# Patient Record
Sex: Male | Born: 1950 | Race: White
Health system: Southern US, Community
[De-identification: ages and names within clinical notes are randomized; demographics above are authoritative.]

---

## 2016-10-28 DIAGNOSIS — Z794 Long term (current) use of insulin: Secondary | ICD-10-CM | POA: Diagnosis not present

## 2016-10-28 DIAGNOSIS — E1142 Type 2 diabetes mellitus with diabetic polyneuropathy: Secondary | ICD-10-CM | POA: Diagnosis not present

## 2016-10-28 DIAGNOSIS — E1165 Type 2 diabetes mellitus with hyperglycemia: Secondary | ICD-10-CM | POA: Diagnosis not present

## 2016-10-28 DIAGNOSIS — Z89412 Acquired absence of left great toe: Secondary | ICD-10-CM | POA: Diagnosis not present

## 2016-10-28 DIAGNOSIS — M2041 Other hammer toe(s) (acquired), right foot: Secondary | ICD-10-CM | POA: Diagnosis not present

## 2016-11-25 DIAGNOSIS — Z86010 Personal history of colonic polyps: Secondary | ICD-10-CM | POA: Diagnosis not present

## 2016-11-25 DIAGNOSIS — D122 Benign neoplasm of ascending colon: Secondary | ICD-10-CM | POA: Diagnosis not present

## 2016-11-25 DIAGNOSIS — Z79899 Other long term (current) drug therapy: Secondary | ICD-10-CM | POA: Diagnosis not present

## 2016-11-25 DIAGNOSIS — K219 Gastro-esophageal reflux disease without esophagitis: Secondary | ICD-10-CM | POA: Diagnosis not present

## 2016-11-25 DIAGNOSIS — I10 Essential (primary) hypertension: Secondary | ICD-10-CM | POA: Diagnosis not present

## 2016-11-25 DIAGNOSIS — R05 Cough: Secondary | ICD-10-CM | POA: Diagnosis not present

## 2016-11-25 DIAGNOSIS — K2270 Barrett's esophagus without dysplasia: Secondary | ICD-10-CM | POA: Diagnosis not present

## 2016-11-25 DIAGNOSIS — J449 Chronic obstructive pulmonary disease, unspecified: Secondary | ICD-10-CM | POA: Diagnosis not present

## 2016-11-25 DIAGNOSIS — K449 Diaphragmatic hernia without obstruction or gangrene: Secondary | ICD-10-CM | POA: Diagnosis not present

## 2016-11-25 DIAGNOSIS — Z1211 Encounter for screening for malignant neoplasm of colon: Secondary | ICD-10-CM | POA: Diagnosis not present

## 2016-11-25 DIAGNOSIS — K5730 Diverticulosis of large intestine without perforation or abscess without bleeding: Secondary | ICD-10-CM | POA: Diagnosis not present

## 2016-11-25 DIAGNOSIS — K208 Other esophagitis: Secondary | ICD-10-CM | POA: Diagnosis not present

## 2016-11-25 DIAGNOSIS — K2900 Acute gastritis without bleeding: Secondary | ICD-10-CM | POA: Diagnosis not present

## 2016-11-25 DIAGNOSIS — F419 Anxiety disorder, unspecified: Secondary | ICD-10-CM | POA: Diagnosis not present

## 2016-11-25 DIAGNOSIS — Z794 Long term (current) use of insulin: Secondary | ICD-10-CM | POA: Diagnosis not present

## 2016-11-25 DIAGNOSIS — K635 Polyp of colon: Secondary | ICD-10-CM | POA: Diagnosis not present

## 2016-11-25 DIAGNOSIS — E785 Hyperlipidemia, unspecified: Secondary | ICD-10-CM | POA: Diagnosis not present

## 2016-11-25 DIAGNOSIS — E119 Type 2 diabetes mellitus without complications: Secondary | ICD-10-CM | POA: Diagnosis not present

## 2016-11-25 DIAGNOSIS — K2970 Gastritis, unspecified, without bleeding: Secondary | ICD-10-CM | POA: Diagnosis not present

## 2016-11-25 DIAGNOSIS — K209 Esophagitis, unspecified: Secondary | ICD-10-CM | POA: Diagnosis not present

## 2016-12-13 DIAGNOSIS — Z794 Long term (current) use of insulin: Secondary | ICD-10-CM | POA: Diagnosis not present

## 2016-12-13 DIAGNOSIS — I10 Essential (primary) hypertension: Secondary | ICD-10-CM | POA: Diagnosis not present

## 2016-12-13 DIAGNOSIS — E782 Mixed hyperlipidemia: Secondary | ICD-10-CM | POA: Diagnosis not present

## 2016-12-13 DIAGNOSIS — E1165 Type 2 diabetes mellitus with hyperglycemia: Secondary | ICD-10-CM | POA: Diagnosis not present

## 2016-12-13 DIAGNOSIS — E1142 Type 2 diabetes mellitus with diabetic polyneuropathy: Secondary | ICD-10-CM | POA: Diagnosis not present

## 2017-01-14 ENCOUNTER — Other Ambulatory Visit: Payer: Self-pay | Admitting: Pharmacist

## 2017-01-14 NOTE — Patient Outreach (Signed)
[facility]) Care Management  01/14/2017  Jonathon Martinez 1951/03/07 [id number]  Late entry for 01/12/17.   Patient was referred to [place] by [employer] for evaluation of patient assistance options for medications, reportedly insulin.    No answer, HIPAA compliant message left requesting return call.   Plan:  If no return call, will make another outreach attempt next week.   Karrie Meres, PharmD, [place] [phone]

## 2017-01-24 ENCOUNTER — Other Ambulatory Visit: Payer: Self-pay | Admitting: Pharmacist

## 2017-01-24 NOTE — Patient Outreach (Signed)
[place] [facility]) Care Management  01/24/2017  ESEQUIEL KLEINFELTER 1951/07/25 [id number]  Second unsuccessful phone outreach attempt to patient.  No answer, HIPAA compliant message left requesting return call.   Plan:  If no return call, will make third outreach attempt in the next 10 business days.   Karrie Meres, PharmD, [place] [phone]

## 2017-01-27 ENCOUNTER — Other Ambulatory Visit: Payer: Self-pay | Admitting: Pharmacist

## 2017-01-27 NOTE — Patient Outreach (Signed)
[facility]) Care Management  01/27/2017  BURNIS HALLING [birth year] [id number]  Third unsuccessful phone outreach attempt to patient.  HIPAA compliant message left requesting return call.   Plan:  Will make one final outreach attempt next week if patient does not return call.   Karrie Meres, PharmD, [place] [phone]

## 2017-02-03 ENCOUNTER — Other Ambulatory Visit: Payer: Self-pay | Admitting: Pharmacist

## 2017-02-03 NOTE — Patient Outreach (Signed)
[place]) Care Management  02/03/2017  Jonathon Martinez 10-26-1950 [id number]  Fourth unsuccessful phone outreach attempt to patient.  No answer and call picked up then disconnected twice with no one answering.    Plan:  Will send patient outreach letter.  If no response in 10 days, will close case.   Karrie Meres, PharmD, [place] [phone]

## 2017-02-07 ENCOUNTER — Encounter: Payer: Self-pay | Admitting: Pharmacist

## 2017-02-21 ENCOUNTER — Other Ambulatory Visit: Payer: Self-pay | Admitting: Pharmacist

## 2017-02-21 NOTE — Patient Outreach (Signed)
[facility]) Care Management  02/21/2017  Jonathon Martinez [birth year] [id number]  No patient response from four phone outreach attempts or outreach letter being sent to patient.   Plan:  Will close patient's pharmacy case due to no patient response.   Karrie Meres, PharmD, [place] [phone]

## 2017-02-23 DIAGNOSIS — D509 Iron deficiency anemia, unspecified: Secondary | ICD-10-CM | POA: Diagnosis not present

## 2017-02-23 DIAGNOSIS — K219 Gastro-esophageal reflux disease without esophagitis: Secondary | ICD-10-CM | POA: Diagnosis not present

## 2017-02-23 DIAGNOSIS — J309 Allergic rhinitis, unspecified: Secondary | ICD-10-CM | POA: Diagnosis not present

## 2017-02-23 DIAGNOSIS — Z89412 Acquired absence of left great toe: Secondary | ICD-10-CM | POA: Diagnosis not present

## 2017-02-23 DIAGNOSIS — Z79899 Other long term (current) drug therapy: Secondary | ICD-10-CM | POA: Diagnosis not present

## 2017-02-23 DIAGNOSIS — I10 Essential (primary) hypertension: Secondary | ICD-10-CM | POA: Diagnosis not present

## 2017-02-23 DIAGNOSIS — M2550 Pain in unspecified joint: Secondary | ICD-10-CM | POA: Diagnosis not present

## 2017-02-23 DIAGNOSIS — E1165 Type 2 diabetes mellitus with hyperglycemia: Secondary | ICD-10-CM | POA: Diagnosis not present

## 2017-02-23 DIAGNOSIS — G4733 Obstructive sleep apnea (adult) (pediatric): Secondary | ICD-10-CM | POA: Diagnosis not present

## 2017-02-23 DIAGNOSIS — E1142 Type 2 diabetes mellitus with diabetic polyneuropathy: Secondary | ICD-10-CM | POA: Diagnosis not present

## 2017-02-23 DIAGNOSIS — J4520 Mild intermittent asthma, uncomplicated: Secondary | ICD-10-CM | POA: Diagnosis not present

## 2017-02-23 DIAGNOSIS — I6523 Occlusion and stenosis of bilateral carotid arteries: Secondary | ICD-10-CM | POA: Diagnosis not present

## 2017-02-23 DIAGNOSIS — K2270 Barrett's esophagus without dysplasia: Secondary | ICD-10-CM | POA: Diagnosis not present

## 2017-03-25 DIAGNOSIS — S0181XA Laceration without foreign body of other part of head, initial encounter: Secondary | ICD-10-CM | POA: Diagnosis not present

## 2017-05-02 DIAGNOSIS — L0202 Furuncle of face: Secondary | ICD-10-CM | POA: Diagnosis not present

## 2017-05-02 DIAGNOSIS — R220 Localized swelling, mass and lump, head: Secondary | ICD-10-CM | POA: Diagnosis not present

## 2017-06-03 DIAGNOSIS — L0291 Cutaneous abscess, unspecified: Secondary | ICD-10-CM | POA: Diagnosis not present

## 2017-06-03 DIAGNOSIS — Z794 Long term (current) use of insulin: Secondary | ICD-10-CM | POA: Diagnosis not present

## 2017-06-03 DIAGNOSIS — E1142 Type 2 diabetes mellitus with diabetic polyneuropathy: Secondary | ICD-10-CM | POA: Diagnosis not present

## 2017-06-03 DIAGNOSIS — E1165 Type 2 diabetes mellitus with hyperglycemia: Secondary | ICD-10-CM | POA: Diagnosis not present

## 2017-06-20 DIAGNOSIS — L0201 Cutaneous abscess of face: Secondary | ICD-10-CM | POA: Diagnosis not present

## 2017-06-20 DIAGNOSIS — L0291 Cutaneous abscess, unspecified: Secondary | ICD-10-CM | POA: Diagnosis not present

## 2017-06-20 DIAGNOSIS — L089 Local infection of the skin and subcutaneous tissue, unspecified: Secondary | ICD-10-CM | POA: Diagnosis not present

## 2017-06-20 DIAGNOSIS — S0085XA Superficial foreign body of other part of head, initial encounter: Secondary | ICD-10-CM | POA: Diagnosis not present

## 2017-07-04 DIAGNOSIS — L089 Local infection of the skin and subcutaneous tissue, unspecified: Secondary | ICD-10-CM | POA: Diagnosis not present

## 2017-07-04 DIAGNOSIS — S0085XD Superficial foreign body of other part of head, subsequent encounter: Secondary | ICD-10-CM | POA: Diagnosis not present

## 2017-07-25 DIAGNOSIS — E1142 Type 2 diabetes mellitus with diabetic polyneuropathy: Secondary | ICD-10-CM | POA: Diagnosis not present

## 2017-07-25 DIAGNOSIS — E1165 Type 2 diabetes mellitus with hyperglycemia: Secondary | ICD-10-CM | POA: Diagnosis not present

## 2017-07-25 DIAGNOSIS — I10 Essential (primary) hypertension: Secondary | ICD-10-CM | POA: Diagnosis not present

## 2017-07-25 DIAGNOSIS — Z794 Long term (current) use of insulin: Secondary | ICD-10-CM | POA: Diagnosis not present

## 2017-07-25 DIAGNOSIS — E782 Mixed hyperlipidemia: Secondary | ICD-10-CM | POA: Diagnosis not present

## 2017-11-02 DIAGNOSIS — L02611 Cutaneous abscess of right foot: Secondary | ICD-10-CM | POA: Diagnosis not present

## 2017-11-02 DIAGNOSIS — Z89419 Acquired absence of unspecified great toe: Secondary | ICD-10-CM | POA: Diagnosis not present

## 2017-11-02 DIAGNOSIS — E1142 Type 2 diabetes mellitus with diabetic polyneuropathy: Secondary | ICD-10-CM | POA: Diagnosis not present

## 2017-11-02 DIAGNOSIS — E11621 Type 2 diabetes mellitus with foot ulcer: Secondary | ICD-10-CM | POA: Diagnosis not present

## 2017-11-03 DIAGNOSIS — L02611 Cutaneous abscess of right foot: Secondary | ICD-10-CM | POA: Diagnosis not present

## 2017-11-17 DIAGNOSIS — E1142 Type 2 diabetes mellitus with diabetic polyneuropathy: Secondary | ICD-10-CM | POA: Diagnosis not present

## 2017-11-17 DIAGNOSIS — L03031 Cellulitis of right toe: Secondary | ICD-10-CM | POA: Diagnosis not present

## 2017-11-17 DIAGNOSIS — L97512 Non-pressure chronic ulcer of other part of right foot with fat layer exposed: Secondary | ICD-10-CM | POA: Diagnosis not present

## 2017-11-17 DIAGNOSIS — E11621 Type 2 diabetes mellitus with foot ulcer: Secondary | ICD-10-CM | POA: Diagnosis not present

## 2018-02-20 DIAGNOSIS — E782 Mixed hyperlipidemia: Secondary | ICD-10-CM | POA: Diagnosis not present

## 2018-02-20 DIAGNOSIS — Z794 Long term (current) use of insulin: Secondary | ICD-10-CM | POA: Diagnosis not present

## 2018-02-20 DIAGNOSIS — E1165 Type 2 diabetes mellitus with hyperglycemia: Secondary | ICD-10-CM | POA: Diagnosis not present

## 2018-02-20 DIAGNOSIS — I10 Essential (primary) hypertension: Secondary | ICD-10-CM | POA: Diagnosis not present

## 2018-02-20 DIAGNOSIS — E1142 Type 2 diabetes mellitus with diabetic polyneuropathy: Secondary | ICD-10-CM | POA: Diagnosis not present

## 2018-04-20 DIAGNOSIS — E782 Mixed hyperlipidemia: Secondary | ICD-10-CM | POA: Diagnosis not present

## 2018-04-20 DIAGNOSIS — Z125 Encounter for screening for malignant neoplasm of prostate: Secondary | ICD-10-CM | POA: Diagnosis not present

## 2018-04-20 DIAGNOSIS — R358 Other polyuria: Secondary | ICD-10-CM | POA: Diagnosis not present

## 2018-04-20 DIAGNOSIS — F331 Major depressive disorder, recurrent, moderate: Secondary | ICD-10-CM | POA: Diagnosis not present

## 2018-04-20 DIAGNOSIS — K2270 Barrett's esophagus without dysplasia: Secondary | ICD-10-CM | POA: Diagnosis not present

## 2018-04-20 DIAGNOSIS — Z79899 Other long term (current) drug therapy: Secondary | ICD-10-CM | POA: Diagnosis not present

## 2018-04-20 DIAGNOSIS — E1142 Type 2 diabetes mellitus with diabetic polyneuropathy: Secondary | ICD-10-CM | POA: Diagnosis not present

## 2018-04-20 DIAGNOSIS — I10 Essential (primary) hypertension: Secondary | ICD-10-CM | POA: Diagnosis not present

## 2018-04-20 DIAGNOSIS — Z794 Long term (current) use of insulin: Secondary | ICD-10-CM | POA: Diagnosis not present

## 2018-04-20 DIAGNOSIS — D509 Iron deficiency anemia, unspecified: Secondary | ICD-10-CM | POA: Diagnosis not present

## 2018-04-20 DIAGNOSIS — E1165 Type 2 diabetes mellitus with hyperglycemia: Secondary | ICD-10-CM | POA: Diagnosis not present

## 2018-04-20 DIAGNOSIS — K219 Gastro-esophageal reflux disease without esophagitis: Secondary | ICD-10-CM | POA: Diagnosis not present

## 2018-05-11 DIAGNOSIS — N318 Other neuromuscular dysfunction of bladder: Secondary | ICD-10-CM | POA: Diagnosis not present

## 2018-05-11 DIAGNOSIS — N3000 Acute cystitis without hematuria: Secondary | ICD-10-CM | POA: Diagnosis not present

## 2018-05-11 DIAGNOSIS — R972 Elevated prostate specific antigen [PSA]: Secondary | ICD-10-CM | POA: Diagnosis not present

## 2018-05-11 DIAGNOSIS — N401 Enlarged prostate with lower urinary tract symptoms: Secondary | ICD-10-CM | POA: Diagnosis not present

## 2018-05-17 DIAGNOSIS — R972 Elevated prostate specific antigen [PSA]: Secondary | ICD-10-CM | POA: Diagnosis not present

## 2018-05-24 DIAGNOSIS — R972 Elevated prostate specific antigen [PSA]: Secondary | ICD-10-CM | POA: Diagnosis not present

## 2018-06-26 DIAGNOSIS — E782 Mixed hyperlipidemia: Secondary | ICD-10-CM | POA: Diagnosis not present

## 2018-06-26 DIAGNOSIS — I10 Essential (primary) hypertension: Secondary | ICD-10-CM | POA: Diagnosis not present

## 2018-06-26 DIAGNOSIS — E1142 Type 2 diabetes mellitus with diabetic polyneuropathy: Secondary | ICD-10-CM | POA: Diagnosis not present

## 2018-06-26 DIAGNOSIS — Z794 Long term (current) use of insulin: Secondary | ICD-10-CM | POA: Diagnosis not present

## 2018-06-26 DIAGNOSIS — E1165 Type 2 diabetes mellitus with hyperglycemia: Secondary | ICD-10-CM | POA: Diagnosis not present

## 2018-07-10 DIAGNOSIS — M2041 Other hammer toe(s) (acquired), right foot: Secondary | ICD-10-CM | POA: Diagnosis not present

## 2018-07-10 DIAGNOSIS — E1165 Type 2 diabetes mellitus with hyperglycemia: Secondary | ICD-10-CM | POA: Diagnosis not present

## 2018-07-10 DIAGNOSIS — E1142 Type 2 diabetes mellitus with diabetic polyneuropathy: Secondary | ICD-10-CM | POA: Diagnosis not present

## 2018-08-21 DIAGNOSIS — E782 Mixed hyperlipidemia: Secondary | ICD-10-CM | POA: Diagnosis not present

## 2018-08-21 DIAGNOSIS — E1142 Type 2 diabetes mellitus with diabetic polyneuropathy: Secondary | ICD-10-CM | POA: Diagnosis not present

## 2018-08-21 DIAGNOSIS — Z0000 Encounter for general adult medical examination without abnormal findings: Secondary | ICD-10-CM | POA: Diagnosis not present

## 2018-08-21 DIAGNOSIS — R972 Elevated prostate specific antigen [PSA]: Secondary | ICD-10-CM | POA: Diagnosis not present

## 2018-08-21 DIAGNOSIS — Z794 Long term (current) use of insulin: Secondary | ICD-10-CM | POA: Diagnosis not present

## 2018-08-21 DIAGNOSIS — F330 Major depressive disorder, recurrent, mild: Secondary | ICD-10-CM | POA: Diagnosis not present

## 2018-08-21 DIAGNOSIS — E1165 Type 2 diabetes mellitus with hyperglycemia: Secondary | ICD-10-CM | POA: Diagnosis not present

## 2018-08-21 DIAGNOSIS — Z79899 Other long term (current) drug therapy: Secondary | ICD-10-CM | POA: Diagnosis not present

## 2018-08-21 DIAGNOSIS — I10 Essential (primary) hypertension: Secondary | ICD-10-CM | POA: Diagnosis not present

## 2018-10-24 DIAGNOSIS — J218 Acute bronchiolitis due to other specified organisms: Secondary | ICD-10-CM | POA: Diagnosis not present

## 2018-10-24 DIAGNOSIS — B9789 Other viral agents as the cause of diseases classified elsewhere: Secondary | ICD-10-CM | POA: Diagnosis not present

## 2018-11-23 DIAGNOSIS — N401 Enlarged prostate with lower urinary tract symptoms: Secondary | ICD-10-CM | POA: Diagnosis not present

## 2018-11-23 DIAGNOSIS — R972 Elevated prostate specific antigen [PSA]: Secondary | ICD-10-CM | POA: Diagnosis not present

## 2018-11-23 DIAGNOSIS — N3020 Other chronic cystitis without hematuria: Secondary | ICD-10-CM | POA: Diagnosis not present

## 2019-01-23 DIAGNOSIS — E1142 Type 2 diabetes mellitus with diabetic polyneuropathy: Secondary | ICD-10-CM | POA: Diagnosis not present

## 2019-01-23 DIAGNOSIS — Z89412 Acquired absence of left great toe: Secondary | ICD-10-CM | POA: Diagnosis not present

## 2019-01-23 DIAGNOSIS — I10 Essential (primary) hypertension: Secondary | ICD-10-CM | POA: Diagnosis not present

## 2019-01-23 DIAGNOSIS — E782 Mixed hyperlipidemia: Secondary | ICD-10-CM | POA: Diagnosis not present

## 2019-01-23 DIAGNOSIS — Z89411 Acquired absence of right great toe: Secondary | ICD-10-CM | POA: Diagnosis not present

## 2019-01-23 DIAGNOSIS — Z794 Long term (current) use of insulin: Secondary | ICD-10-CM | POA: Diagnosis not present

## 2019-01-23 DIAGNOSIS — E1165 Type 2 diabetes mellitus with hyperglycemia: Secondary | ICD-10-CM | POA: Diagnosis not present

## 2019-02-20 DIAGNOSIS — L97512 Non-pressure chronic ulcer of other part of right foot with fat layer exposed: Secondary | ICD-10-CM | POA: Diagnosis not present

## 2019-02-20 DIAGNOSIS — R972 Elevated prostate specific antigen [PSA]: Secondary | ICD-10-CM | POA: Diagnosis not present

## 2019-02-20 DIAGNOSIS — Z89419 Acquired absence of unspecified great toe: Secondary | ICD-10-CM | POA: Diagnosis not present

## 2019-02-20 DIAGNOSIS — N3020 Other chronic cystitis without hematuria: Secondary | ICD-10-CM | POA: Diagnosis not present

## 2019-02-20 DIAGNOSIS — N318 Other neuromuscular dysfunction of bladder: Secondary | ICD-10-CM | POA: Diagnosis not present

## 2019-02-20 DIAGNOSIS — N401 Enlarged prostate with lower urinary tract symptoms: Secondary | ICD-10-CM | POA: Diagnosis not present

## 2019-02-20 DIAGNOSIS — E11621 Type 2 diabetes mellitus with foot ulcer: Secondary | ICD-10-CM | POA: Diagnosis not present

## 2019-02-20 DIAGNOSIS — Z89411 Acquired absence of right great toe: Secondary | ICD-10-CM | POA: Diagnosis not present

## 2019-02-20 DIAGNOSIS — L03115 Cellulitis of right lower limb: Secondary | ICD-10-CM | POA: Diagnosis not present

## 2019-02-27 DIAGNOSIS — Z794 Long term (current) use of insulin: Secondary | ICD-10-CM | POA: Diagnosis not present

## 2019-02-27 DIAGNOSIS — E1142 Type 2 diabetes mellitus with diabetic polyneuropathy: Secondary | ICD-10-CM | POA: Diagnosis not present

## 2019-02-27 DIAGNOSIS — L97522 Non-pressure chronic ulcer of other part of left foot with fat layer exposed: Secondary | ICD-10-CM | POA: Diagnosis not present

## 2019-02-27 DIAGNOSIS — L03115 Cellulitis of right lower limb: Secondary | ICD-10-CM | POA: Diagnosis not present

## 2019-02-27 DIAGNOSIS — L97512 Non-pressure chronic ulcer of other part of right foot with fat layer exposed: Secondary | ICD-10-CM | POA: Diagnosis not present

## 2019-02-27 DIAGNOSIS — E1165 Type 2 diabetes mellitus with hyperglycemia: Secondary | ICD-10-CM | POA: Diagnosis not present

## 2019-02-27 DIAGNOSIS — M19011 Primary osteoarthritis, right shoulder: Secondary | ICD-10-CM | POA: Diagnosis not present

## 2019-03-06 DIAGNOSIS — E1142 Type 2 diabetes mellitus with diabetic polyneuropathy: Secondary | ICD-10-CM | POA: Diagnosis not present

## 2019-03-06 DIAGNOSIS — Z794 Long term (current) use of insulin: Secondary | ICD-10-CM | POA: Diagnosis not present

## 2019-03-06 DIAGNOSIS — L97512 Non-pressure chronic ulcer of other part of right foot with fat layer exposed: Secondary | ICD-10-CM | POA: Diagnosis not present

## 2019-03-06 DIAGNOSIS — L03115 Cellulitis of right lower limb: Secondary | ICD-10-CM | POA: Diagnosis not present

## 2019-03-06 DIAGNOSIS — E1165 Type 2 diabetes mellitus with hyperglycemia: Secondary | ICD-10-CM | POA: Diagnosis not present

## 2019-03-15 DIAGNOSIS — E1142 Type 2 diabetes mellitus with diabetic polyneuropathy: Secondary | ICD-10-CM | POA: Diagnosis not present

## 2019-03-15 DIAGNOSIS — L03115 Cellulitis of right lower limb: Secondary | ICD-10-CM | POA: Diagnosis not present

## 2019-03-15 DIAGNOSIS — Z794 Long term (current) use of insulin: Secondary | ICD-10-CM | POA: Diagnosis not present

## 2019-03-15 DIAGNOSIS — L97512 Non-pressure chronic ulcer of other part of right foot with fat layer exposed: Secondary | ICD-10-CM | POA: Diagnosis not present

## 2019-03-15 DIAGNOSIS — E1165 Type 2 diabetes mellitus with hyperglycemia: Secondary | ICD-10-CM | POA: Diagnosis not present

## 2019-03-15 DIAGNOSIS — E11621 Type 2 diabetes mellitus with foot ulcer: Secondary | ICD-10-CM | POA: Diagnosis not present

## 2019-03-27 DIAGNOSIS — E11621 Type 2 diabetes mellitus with foot ulcer: Secondary | ICD-10-CM | POA: Diagnosis not present

## 2019-03-27 DIAGNOSIS — L97512 Non-pressure chronic ulcer of other part of right foot with fat layer exposed: Secondary | ICD-10-CM | POA: Diagnosis not present

## 2019-04-10 DIAGNOSIS — E11621 Type 2 diabetes mellitus with foot ulcer: Secondary | ICD-10-CM | POA: Diagnosis not present

## 2019-04-10 DIAGNOSIS — L97512 Non-pressure chronic ulcer of other part of right foot with fat layer exposed: Secondary | ICD-10-CM | POA: Diagnosis not present

## 2019-04-26 DIAGNOSIS — L97512 Non-pressure chronic ulcer of other part of right foot with fat layer exposed: Secondary | ICD-10-CM | POA: Diagnosis not present

## 2019-04-26 DIAGNOSIS — E1165 Type 2 diabetes mellitus with hyperglycemia: Secondary | ICD-10-CM | POA: Diagnosis not present

## 2019-04-26 DIAGNOSIS — E1142 Type 2 diabetes mellitus with diabetic polyneuropathy: Secondary | ICD-10-CM | POA: Diagnosis not present

## 2019-04-26 DIAGNOSIS — Z794 Long term (current) use of insulin: Secondary | ICD-10-CM | POA: Diagnosis not present

## 2019-05-10 DIAGNOSIS — Z794 Long term (current) use of insulin: Secondary | ICD-10-CM | POA: Diagnosis not present

## 2019-05-10 DIAGNOSIS — E1142 Type 2 diabetes mellitus with diabetic polyneuropathy: Secondary | ICD-10-CM | POA: Diagnosis not present

## 2019-05-10 DIAGNOSIS — E1165 Type 2 diabetes mellitus with hyperglycemia: Secondary | ICD-10-CM | POA: Diagnosis not present

## 2019-05-10 DIAGNOSIS — L97512 Non-pressure chronic ulcer of other part of right foot with fat layer exposed: Secondary | ICD-10-CM | POA: Diagnosis not present

## 2019-05-10 DIAGNOSIS — Z89411 Acquired absence of right great toe: Secondary | ICD-10-CM | POA: Diagnosis not present

## 2019-05-10 DIAGNOSIS — Z89412 Acquired absence of left great toe: Secondary | ICD-10-CM | POA: Diagnosis not present

## 2019-05-10 DIAGNOSIS — M2042 Other hammer toe(s) (acquired), left foot: Secondary | ICD-10-CM | POA: Diagnosis not present

## 2019-05-10 DIAGNOSIS — E11621 Type 2 diabetes mellitus with foot ulcer: Secondary | ICD-10-CM | POA: Diagnosis not present

## 2019-05-24 DIAGNOSIS — L97512 Non-pressure chronic ulcer of other part of right foot with fat layer exposed: Secondary | ICD-10-CM | POA: Diagnosis not present

## 2019-05-24 DIAGNOSIS — S98112A Complete traumatic amputation of left great toe, initial encounter: Secondary | ICD-10-CM | POA: Diagnosis not present

## 2019-05-24 DIAGNOSIS — E1142 Type 2 diabetes mellitus with diabetic polyneuropathy: Secondary | ICD-10-CM | POA: Diagnosis not present

## 2019-05-24 DIAGNOSIS — E1165 Type 2 diabetes mellitus with hyperglycemia: Secondary | ICD-10-CM | POA: Diagnosis not present

## 2019-05-24 DIAGNOSIS — Z794 Long term (current) use of insulin: Secondary | ICD-10-CM | POA: Diagnosis not present

## 2019-05-28 DIAGNOSIS — E782 Mixed hyperlipidemia: Secondary | ICD-10-CM | POA: Diagnosis not present

## 2019-05-28 DIAGNOSIS — E1142 Type 2 diabetes mellitus with diabetic polyneuropathy: Secondary | ICD-10-CM | POA: Diagnosis not present

## 2019-05-28 DIAGNOSIS — Z794 Long term (current) use of insulin: Secondary | ICD-10-CM | POA: Diagnosis not present

## 2019-05-28 DIAGNOSIS — R972 Elevated prostate specific antigen [PSA]: Secondary | ICD-10-CM | POA: Diagnosis not present

## 2019-05-28 DIAGNOSIS — I10 Essential (primary) hypertension: Secondary | ICD-10-CM | POA: Diagnosis not present

## 2019-05-28 DIAGNOSIS — E1165 Type 2 diabetes mellitus with hyperglycemia: Secondary | ICD-10-CM | POA: Diagnosis not present

## 2019-06-19 DIAGNOSIS — Z794 Long term (current) use of insulin: Secondary | ICD-10-CM | POA: Diagnosis not present

## 2019-06-19 DIAGNOSIS — E11621 Type 2 diabetes mellitus with foot ulcer: Secondary | ICD-10-CM | POA: Diagnosis not present

## 2019-06-19 DIAGNOSIS — E1142 Type 2 diabetes mellitus with diabetic polyneuropathy: Secondary | ICD-10-CM | POA: Diagnosis not present

## 2019-06-19 DIAGNOSIS — L97511 Non-pressure chronic ulcer of other part of right foot limited to breakdown of skin: Secondary | ICD-10-CM | POA: Diagnosis not present

## 2019-06-19 DIAGNOSIS — E1165 Type 2 diabetes mellitus with hyperglycemia: Secondary | ICD-10-CM | POA: Diagnosis not present

## 2019-06-28 DIAGNOSIS — E1142 Type 2 diabetes mellitus with diabetic polyneuropathy: Secondary | ICD-10-CM | POA: Diagnosis not present

## 2019-06-28 DIAGNOSIS — R972 Elevated prostate specific antigen [PSA]: Secondary | ICD-10-CM | POA: Diagnosis not present

## 2019-06-28 DIAGNOSIS — F330 Major depressive disorder, recurrent, mild: Secondary | ICD-10-CM | POA: Diagnosis not present

## 2019-06-28 DIAGNOSIS — Z794 Long term (current) use of insulin: Secondary | ICD-10-CM | POA: Diagnosis not present

## 2019-06-28 DIAGNOSIS — I10 Essential (primary) hypertension: Secondary | ICD-10-CM | POA: Diagnosis not present

## 2019-06-28 DIAGNOSIS — Z23 Encounter for immunization: Secondary | ICD-10-CM | POA: Diagnosis not present

## 2019-07-10 DIAGNOSIS — N3020 Other chronic cystitis without hematuria: Secondary | ICD-10-CM | POA: Diagnosis not present

## 2019-07-10 DIAGNOSIS — N401 Enlarged prostate with lower urinary tract symptoms: Secondary | ICD-10-CM | POA: Diagnosis not present

## 2019-07-10 DIAGNOSIS — N318 Other neuromuscular dysfunction of bladder: Secondary | ICD-10-CM | POA: Diagnosis not present

## 2019-07-11 DIAGNOSIS — R972 Elevated prostate specific antigen [PSA]: Secondary | ICD-10-CM | POA: Diagnosis not present

## 2019-07-16 DIAGNOSIS — N3020 Other chronic cystitis without hematuria: Secondary | ICD-10-CM | POA: Diagnosis not present

## 2019-07-17 DIAGNOSIS — N3090 Cystitis, unspecified without hematuria: Secondary | ICD-10-CM | POA: Diagnosis not present

## 2019-07-19 DIAGNOSIS — N401 Enlarged prostate with lower urinary tract symptoms: Secondary | ICD-10-CM | POA: Diagnosis not present

## 2019-07-19 DIAGNOSIS — R351 Nocturia: Secondary | ICD-10-CM | POA: Diagnosis not present

## 2019-07-24 DIAGNOSIS — N3020 Other chronic cystitis without hematuria: Secondary | ICD-10-CM | POA: Diagnosis not present

## 2019-07-24 DIAGNOSIS — N318 Other neuromuscular dysfunction of bladder: Secondary | ICD-10-CM | POA: Diagnosis not present

## 2019-07-24 DIAGNOSIS — R351 Nocturia: Secondary | ICD-10-CM | POA: Diagnosis not present

## 2019-07-24 DIAGNOSIS — N401 Enlarged prostate with lower urinary tract symptoms: Secondary | ICD-10-CM | POA: Diagnosis not present

## 2019-07-25 DIAGNOSIS — N390 Urinary tract infection, site not specified: Secondary | ICD-10-CM | POA: Diagnosis not present

## 2019-07-26 DIAGNOSIS — R351 Nocturia: Secondary | ICD-10-CM | POA: Diagnosis not present

## 2019-07-26 DIAGNOSIS — J449 Chronic obstructive pulmonary disease, unspecified: Secondary | ICD-10-CM | POA: Diagnosis not present

## 2019-07-26 DIAGNOSIS — Z48816 Encounter for surgical aftercare following surgery on the genitourinary system: Secondary | ICD-10-CM | POA: Diagnosis not present

## 2019-07-26 DIAGNOSIS — B965 Pseudomonas (aeruginosa) (mallei) (pseudomallei) as the cause of diseases classified elsewhere: Secondary | ICD-10-CM | POA: Diagnosis not present

## 2019-07-26 DIAGNOSIS — R296 Repeated falls: Secondary | ICD-10-CM | POA: Diagnosis not present

## 2019-07-26 DIAGNOSIS — N401 Enlarged prostate with lower urinary tract symptoms: Secondary | ICD-10-CM | POA: Diagnosis not present

## 2019-07-26 DIAGNOSIS — N200 Calculus of kidney: Secondary | ICD-10-CM | POA: Diagnosis not present

## 2019-07-26 DIAGNOSIS — Z794 Long term (current) use of insulin: Secondary | ICD-10-CM | POA: Diagnosis not present

## 2019-07-26 DIAGNOSIS — I10 Essential (primary) hypertension: Secondary | ICD-10-CM | POA: Diagnosis not present

## 2019-07-26 DIAGNOSIS — E119 Type 2 diabetes mellitus without complications: Secondary | ICD-10-CM | POA: Diagnosis not present

## 2019-07-26 DIAGNOSIS — M1990 Unspecified osteoarthritis, unspecified site: Secondary | ICD-10-CM | POA: Diagnosis not present

## 2019-07-26 DIAGNOSIS — G2581 Restless legs syndrome: Secondary | ICD-10-CM | POA: Diagnosis not present

## 2019-07-26 DIAGNOSIS — N390 Urinary tract infection, site not specified: Secondary | ICD-10-CM | POA: Diagnosis not present

## 2019-07-26 DIAGNOSIS — F419 Anxiety disorder, unspecified: Secondary | ICD-10-CM | POA: Diagnosis not present

## 2019-07-26 DIAGNOSIS — D649 Anemia, unspecified: Secondary | ICD-10-CM | POA: Diagnosis not present

## 2019-07-26 DIAGNOSIS — E7800 Pure hypercholesterolemia, unspecified: Secondary | ICD-10-CM | POA: Diagnosis not present

## 2019-07-27 DIAGNOSIS — N401 Enlarged prostate with lower urinary tract symptoms: Secondary | ICD-10-CM | POA: Diagnosis not present

## 2019-07-29 DIAGNOSIS — N390 Urinary tract infection, site not specified: Secondary | ICD-10-CM | POA: Diagnosis not present

## 2019-07-29 DIAGNOSIS — B0229 Other postherpetic nervous system involvement: Secondary | ICD-10-CM | POA: Diagnosis not present

## 2019-07-29 DIAGNOSIS — E119 Type 2 diabetes mellitus without complications: Secondary | ICD-10-CM | POA: Diagnosis not present

## 2019-07-29 DIAGNOSIS — R197 Diarrhea, unspecified: Secondary | ICD-10-CM | POA: Diagnosis not present

## 2019-07-29 DIAGNOSIS — Z789 Other specified health status: Secondary | ICD-10-CM | POA: Diagnosis not present

## 2019-07-29 DIAGNOSIS — E1142 Type 2 diabetes mellitus with diabetic polyneuropathy: Secondary | ICD-10-CM | POA: Diagnosis not present

## 2019-07-29 DIAGNOSIS — Z1624 Resistance to multiple antibiotics: Secondary | ICD-10-CM | POA: Diagnosis not present

## 2019-07-29 DIAGNOSIS — Y999 Unspecified external cause status: Secondary | ICD-10-CM | POA: Diagnosis not present

## 2019-07-29 DIAGNOSIS — R000 Tachycardia, unspecified: Secondary | ICD-10-CM | POA: Diagnosis not present

## 2019-07-29 DIAGNOSIS — B029 Zoster without complications: Secondary | ICD-10-CM | POA: Diagnosis not present

## 2019-07-29 DIAGNOSIS — Z87891 Personal history of nicotine dependence: Secondary | ICD-10-CM | POA: Diagnosis not present

## 2019-07-29 DIAGNOSIS — Z79899 Other long term (current) drug therapy: Secondary | ICD-10-CM | POA: Diagnosis not present

## 2019-07-29 DIAGNOSIS — Z794 Long term (current) use of insulin: Secondary | ICD-10-CM | POA: Diagnosis not present

## 2019-07-29 DIAGNOSIS — E871 Hypo-osmolality and hyponatremia: Secondary | ICD-10-CM | POA: Diagnosis not present

## 2019-07-29 DIAGNOSIS — T83511A Infection and inflammatory reaction due to indwelling urethral catheter, initial encounter: Secondary | ICD-10-CM | POA: Diagnosis not present

## 2019-07-29 DIAGNOSIS — E785 Hyperlipidemia, unspecified: Secondary | ICD-10-CM | POA: Diagnosis not present

## 2019-07-29 DIAGNOSIS — K219 Gastro-esophageal reflux disease without esophagitis: Secondary | ICD-10-CM | POA: Diagnosis not present

## 2019-07-29 DIAGNOSIS — I10 Essential (primary) hypertension: Secondary | ICD-10-CM | POA: Diagnosis not present

## 2019-07-29 DIAGNOSIS — F419 Anxiety disorder, unspecified: Secondary | ICD-10-CM | POA: Diagnosis not present

## 2019-07-29 DIAGNOSIS — N4889 Other specified disorders of penis: Secondary | ICD-10-CM | POA: Diagnosis not present

## 2019-07-29 DIAGNOSIS — N401 Enlarged prostate with lower urinary tract symptoms: Secondary | ICD-10-CM | POA: Diagnosis not present

## 2019-07-29 DIAGNOSIS — X58XXXA Exposure to other specified factors, initial encounter: Secondary | ICD-10-CM | POA: Diagnosis not present

## 2019-07-30 DIAGNOSIS — Z794 Long term (current) use of insulin: Secondary | ICD-10-CM | POA: Diagnosis not present

## 2019-07-30 DIAGNOSIS — I10 Essential (primary) hypertension: Secondary | ICD-10-CM | POA: Diagnosis not present

## 2019-07-30 DIAGNOSIS — E119 Type 2 diabetes mellitus without complications: Secondary | ICD-10-CM | POA: Diagnosis not present

## 2019-07-30 DIAGNOSIS — N390 Urinary tract infection, site not specified: Secondary | ICD-10-CM | POA: Diagnosis not present

## 2019-07-30 DIAGNOSIS — B029 Zoster without complications: Secondary | ICD-10-CM | POA: Diagnosis not present

## 2019-07-31 DIAGNOSIS — N390 Urinary tract infection, site not specified: Secondary | ICD-10-CM | POA: Diagnosis not present

## 2019-07-31 DIAGNOSIS — B029 Zoster without complications: Secondary | ICD-10-CM | POA: Diagnosis not present

## 2019-07-31 DIAGNOSIS — I10 Essential (primary) hypertension: Secondary | ICD-10-CM | POA: Diagnosis not present

## 2019-07-31 DIAGNOSIS — B0229 Other postherpetic nervous system involvement: Secondary | ICD-10-CM | POA: Diagnosis not present

## 2019-07-31 DIAGNOSIS — Z1639 Resistance to other specified antimicrobial drug: Secondary | ICD-10-CM | POA: Diagnosis not present

## 2019-07-31 DIAGNOSIS — R339 Retention of urine, unspecified: Secondary | ICD-10-CM | POA: Diagnosis not present

## 2019-07-31 DIAGNOSIS — B965 Pseudomonas (aeruginosa) (mallei) (pseudomallei) as the cause of diseases classified elsewhere: Secondary | ICD-10-CM | POA: Diagnosis not present

## 2019-08-01 DIAGNOSIS — T83518A Infection and inflammatory reaction due to other urinary catheter, initial encounter: Secondary | ICD-10-CM | POA: Diagnosis not present

## 2019-08-01 DIAGNOSIS — B0229 Other postherpetic nervous system involvement: Secondary | ICD-10-CM | POA: Diagnosis not present

## 2019-08-01 DIAGNOSIS — B028 Zoster with other complications: Secondary | ICD-10-CM | POA: Diagnosis not present

## 2019-08-01 DIAGNOSIS — N390 Urinary tract infection, site not specified: Secondary | ICD-10-CM | POA: Diagnosis not present

## 2019-08-03 DIAGNOSIS — E119 Type 2 diabetes mellitus without complications: Secondary | ICD-10-CM | POA: Diagnosis not present

## 2019-08-09 DIAGNOSIS — T83511S Infection and inflammatory reaction due to indwelling urethral catheter, sequela: Secondary | ICD-10-CM | POA: Diagnosis not present

## 2019-08-09 DIAGNOSIS — E1142 Type 2 diabetes mellitus with diabetic polyneuropathy: Secondary | ICD-10-CM | POA: Diagnosis not present

## 2019-08-09 DIAGNOSIS — Z794 Long term (current) use of insulin: Secondary | ICD-10-CM | POA: Diagnosis not present

## 2019-08-09 DIAGNOSIS — B0229 Other postherpetic nervous system involvement: Secondary | ICD-10-CM | POA: Diagnosis not present

## 2019-08-09 DIAGNOSIS — E782 Mixed hyperlipidemia: Secondary | ICD-10-CM | POA: Diagnosis not present

## 2019-08-09 DIAGNOSIS — I10 Essential (primary) hypertension: Secondary | ICD-10-CM | POA: Diagnosis not present

## 2019-08-09 DIAGNOSIS — N390 Urinary tract infection, site not specified: Secondary | ICD-10-CM | POA: Diagnosis not present

## 2019-09-03 DIAGNOSIS — D649 Anemia, unspecified: Secondary | ICD-10-CM | POA: Diagnosis not present

## 2019-09-03 DIAGNOSIS — F330 Major depressive disorder, recurrent, mild: Secondary | ICD-10-CM | POA: Diagnosis not present

## 2019-09-03 DIAGNOSIS — Z794 Long term (current) use of insulin: Secondary | ICD-10-CM | POA: Diagnosis not present

## 2019-09-03 DIAGNOSIS — Z23 Encounter for immunization: Secondary | ICD-10-CM | POA: Diagnosis not present

## 2019-09-03 DIAGNOSIS — I10 Essential (primary) hypertension: Secondary | ICD-10-CM | POA: Diagnosis not present

## 2019-09-03 DIAGNOSIS — E1142 Type 2 diabetes mellitus with diabetic polyneuropathy: Secondary | ICD-10-CM | POA: Diagnosis not present

## 2019-09-03 DIAGNOSIS — R972 Elevated prostate specific antigen [PSA]: Secondary | ICD-10-CM | POA: Diagnosis not present

## 2019-09-03 DIAGNOSIS — Z0000 Encounter for general adult medical examination without abnormal findings: Secondary | ICD-10-CM | POA: Diagnosis not present

## 2019-10-02 DIAGNOSIS — Z794 Long term (current) use of insulin: Secondary | ICD-10-CM | POA: Diagnosis not present

## 2019-10-02 DIAGNOSIS — Z89412 Acquired absence of left great toe: Secondary | ICD-10-CM | POA: Diagnosis not present

## 2019-10-02 DIAGNOSIS — E11621 Type 2 diabetes mellitus with foot ulcer: Secondary | ICD-10-CM | POA: Diagnosis not present

## 2019-10-02 DIAGNOSIS — Z89411 Acquired absence of right great toe: Secondary | ICD-10-CM | POA: Diagnosis not present

## 2019-10-02 DIAGNOSIS — L97512 Non-pressure chronic ulcer of other part of right foot with fat layer exposed: Secondary | ICD-10-CM | POA: Diagnosis not present

## 2019-10-02 DIAGNOSIS — E1142 Type 2 diabetes mellitus with diabetic polyneuropathy: Secondary | ICD-10-CM | POA: Diagnosis not present

## 2019-11-01 DIAGNOSIS — Z23 Encounter for immunization: Secondary | ICD-10-CM | POA: Diagnosis not present

## 2019-11-22 DIAGNOSIS — N401 Enlarged prostate with lower urinary tract symptoms: Secondary | ICD-10-CM | POA: Diagnosis not present

## 2019-11-22 DIAGNOSIS — R3916 Straining to void: Secondary | ICD-10-CM | POA: Diagnosis not present

## 2019-11-22 DIAGNOSIS — R350 Frequency of micturition: Secondary | ICD-10-CM | POA: Diagnosis not present

## 2019-11-22 DIAGNOSIS — F330 Major depressive disorder, recurrent, mild: Secondary | ICD-10-CM | POA: Diagnosis not present

## 2019-12-04 DIAGNOSIS — E782 Mixed hyperlipidemia: Secondary | ICD-10-CM | POA: Diagnosis not present

## 2019-12-04 DIAGNOSIS — E1142 Type 2 diabetes mellitus with diabetic polyneuropathy: Secondary | ICD-10-CM | POA: Diagnosis not present

## 2019-12-04 DIAGNOSIS — I10 Essential (primary) hypertension: Secondary | ICD-10-CM | POA: Diagnosis not present

## 2019-12-04 DIAGNOSIS — S98112A Complete traumatic amputation of left great toe, initial encounter: Secondary | ICD-10-CM | POA: Diagnosis not present

## 2019-12-04 DIAGNOSIS — Z794 Long term (current) use of insulin: Secondary | ICD-10-CM | POA: Diagnosis not present

## 2020-01-01 DIAGNOSIS — N401 Enlarged prostate with lower urinary tract symptoms: Secondary | ICD-10-CM | POA: Diagnosis not present

## 2020-01-01 DIAGNOSIS — R351 Nocturia: Secondary | ICD-10-CM | POA: Diagnosis not present

## 2020-01-02 DIAGNOSIS — Z794 Long term (current) use of insulin: Secondary | ICD-10-CM | POA: Diagnosis not present

## 2020-01-02 DIAGNOSIS — F330 Major depressive disorder, recurrent, mild: Secondary | ICD-10-CM | POA: Diagnosis not present

## 2020-01-02 DIAGNOSIS — K2270 Barrett's esophagus without dysplasia: Secondary | ICD-10-CM | POA: Diagnosis not present

## 2020-01-02 DIAGNOSIS — E782 Mixed hyperlipidemia: Secondary | ICD-10-CM | POA: Diagnosis not present

## 2020-01-02 DIAGNOSIS — I10 Essential (primary) hypertension: Secondary | ICD-10-CM | POA: Diagnosis not present

## 2020-01-02 DIAGNOSIS — K219 Gastro-esophageal reflux disease without esophagitis: Secondary | ICD-10-CM | POA: Diagnosis not present

## 2020-01-02 DIAGNOSIS — E1142 Type 2 diabetes mellitus with diabetic polyneuropathy: Secondary | ICD-10-CM | POA: Diagnosis not present

## 2020-01-02 DIAGNOSIS — Z8639 Personal history of other endocrine, nutritional and metabolic disease: Secondary | ICD-10-CM | POA: Diagnosis not present

## 2020-02-12 DIAGNOSIS — N401 Enlarged prostate with lower urinary tract symptoms: Secondary | ICD-10-CM | POA: Diagnosis not present

## 2020-02-12 DIAGNOSIS — R351 Nocturia: Secondary | ICD-10-CM | POA: Diagnosis not present

## 2020-02-12 DIAGNOSIS — R972 Elevated prostate specific antigen [PSA]: Secondary | ICD-10-CM | POA: Diagnosis not present

## 2020-03-13 DIAGNOSIS — Z794 Long term (current) use of insulin: Secondary | ICD-10-CM | POA: Diagnosis not present

## 2020-03-13 DIAGNOSIS — E11621 Type 2 diabetes mellitus with foot ulcer: Secondary | ICD-10-CM | POA: Diagnosis not present

## 2020-03-13 DIAGNOSIS — S98112A Complete traumatic amputation of left great toe, initial encounter: Secondary | ICD-10-CM | POA: Diagnosis not present

## 2020-03-13 DIAGNOSIS — E782 Mixed hyperlipidemia: Secondary | ICD-10-CM | POA: Diagnosis not present

## 2020-03-13 DIAGNOSIS — E1142 Type 2 diabetes mellitus with diabetic polyneuropathy: Secondary | ICD-10-CM | POA: Diagnosis not present

## 2020-03-13 DIAGNOSIS — I10 Essential (primary) hypertension: Secondary | ICD-10-CM | POA: Diagnosis not present

## 2020-03-13 DIAGNOSIS — L97509 Non-pressure chronic ulcer of other part of unspecified foot with unspecified severity: Secondary | ICD-10-CM | POA: Diagnosis not present

## 2020-03-17 DIAGNOSIS — R351 Nocturia: Secondary | ICD-10-CM | POA: Diagnosis not present

## 2020-03-17 DIAGNOSIS — N401 Enlarged prostate with lower urinary tract symptoms: Secondary | ICD-10-CM | POA: Diagnosis not present

## 2020-04-17 DIAGNOSIS — N401 Enlarged prostate with lower urinary tract symptoms: Secondary | ICD-10-CM | POA: Diagnosis not present

## 2020-04-17 DIAGNOSIS — R351 Nocturia: Secondary | ICD-10-CM | POA: Diagnosis not present

## 2020-04-17 DIAGNOSIS — R972 Elevated prostate specific antigen [PSA]: Secondary | ICD-10-CM | POA: Diagnosis not present

## 2020-04-21 DIAGNOSIS — Z794 Long term (current) use of insulin: Secondary | ICD-10-CM | POA: Diagnosis not present

## 2020-04-21 DIAGNOSIS — L97509 Non-pressure chronic ulcer of other part of unspecified foot with unspecified severity: Secondary | ICD-10-CM | POA: Diagnosis not present

## 2020-04-21 DIAGNOSIS — E11621 Type 2 diabetes mellitus with foot ulcer: Secondary | ICD-10-CM | POA: Diagnosis not present

## 2020-04-21 DIAGNOSIS — L03031 Cellulitis of right toe: Secondary | ICD-10-CM | POA: Diagnosis not present

## 2020-04-24 DIAGNOSIS — F3341 Major depressive disorder, recurrent, in partial remission: Secondary | ICD-10-CM | POA: Diagnosis not present

## 2020-04-24 DIAGNOSIS — M2042 Other hammer toe(s) (acquired), left foot: Secondary | ICD-10-CM | POA: Diagnosis not present

## 2020-04-24 DIAGNOSIS — Z89421 Acquired absence of other right toe(s): Secondary | ICD-10-CM | POA: Diagnosis not present

## 2020-04-24 DIAGNOSIS — Z89411 Acquired absence of right great toe: Secondary | ICD-10-CM | POA: Diagnosis not present

## 2020-04-24 DIAGNOSIS — Z7409 Other reduced mobility: Secondary | ICD-10-CM | POA: Diagnosis not present

## 2020-04-24 DIAGNOSIS — N400 Enlarged prostate without lower urinary tract symptoms: Secondary | ICD-10-CM | POA: Diagnosis not present

## 2020-04-24 DIAGNOSIS — I10 Essential (primary) hypertension: Secondary | ICD-10-CM | POA: Diagnosis not present

## 2020-04-24 DIAGNOSIS — E782 Mixed hyperlipidemia: Secondary | ICD-10-CM | POA: Diagnosis not present

## 2020-04-24 DIAGNOSIS — Z794 Long term (current) use of insulin: Secondary | ICD-10-CM | POA: Diagnosis not present

## 2020-04-24 DIAGNOSIS — R350 Frequency of micturition: Secondary | ICD-10-CM | POA: Diagnosis not present

## 2020-04-24 DIAGNOSIS — L97519 Non-pressure chronic ulcer of other part of right foot with unspecified severity: Secondary | ICD-10-CM | POA: Diagnosis not present

## 2020-04-24 DIAGNOSIS — R278 Other lack of coordination: Secondary | ICD-10-CM | POA: Diagnosis not present

## 2020-04-24 DIAGNOSIS — J309 Allergic rhinitis, unspecified: Secondary | ICD-10-CM | POA: Diagnosis not present

## 2020-04-24 DIAGNOSIS — B9561 Methicillin susceptible Staphylococcus aureus infection as the cause of diseases classified elsewhere: Secondary | ICD-10-CM | POA: Diagnosis not present

## 2020-04-24 DIAGNOSIS — I739 Peripheral vascular disease, unspecified: Secondary | ICD-10-CM | POA: Diagnosis not present

## 2020-04-24 DIAGNOSIS — D500 Iron deficiency anemia secondary to blood loss (chronic): Secondary | ICD-10-CM | POA: Diagnosis not present

## 2020-04-24 DIAGNOSIS — I152 Hypertension secondary to endocrine disorders: Secondary | ICD-10-CM | POA: Diagnosis not present

## 2020-04-24 DIAGNOSIS — B9620 Unspecified Escherichia coli [E. coli] as the cause of diseases classified elsewhere: Secondary | ICD-10-CM | POA: Diagnosis not present

## 2020-04-24 DIAGNOSIS — M6281 Muscle weakness (generalized): Secondary | ICD-10-CM | POA: Diagnosis not present

## 2020-04-24 DIAGNOSIS — R41841 Cognitive communication deficit: Secondary | ICD-10-CM | POA: Diagnosis not present

## 2020-04-24 DIAGNOSIS — K219 Gastro-esophageal reflux disease without esophagitis: Secondary | ICD-10-CM | POA: Diagnosis not present

## 2020-04-24 DIAGNOSIS — S91101A Unspecified open wound of right great toe without damage to nail, initial encounter: Secondary | ICD-10-CM | POA: Diagnosis not present

## 2020-04-24 DIAGNOSIS — K2270 Barrett's esophagus without dysplasia: Secondary | ICD-10-CM | POA: Diagnosis not present

## 2020-04-24 DIAGNOSIS — L97512 Non-pressure chronic ulcer of other part of right foot with fat layer exposed: Secondary | ICD-10-CM | POA: Diagnosis not present

## 2020-04-24 DIAGNOSIS — R4702 Dysphasia: Secondary | ICD-10-CM | POA: Diagnosis not present

## 2020-04-24 DIAGNOSIS — R531 Weakness: Secondary | ICD-10-CM | POA: Diagnosis not present

## 2020-04-24 DIAGNOSIS — E1142 Type 2 diabetes mellitus with diabetic polyneuropathy: Secondary | ICD-10-CM | POA: Diagnosis not present

## 2020-04-24 DIAGNOSIS — J449 Chronic obstructive pulmonary disease, unspecified: Secondary | ICD-10-CM | POA: Diagnosis not present

## 2020-04-24 DIAGNOSIS — R2689 Other abnormalities of gait and mobility: Secondary | ICD-10-CM | POA: Diagnosis not present

## 2020-04-24 DIAGNOSIS — L02611 Cutaneous abscess of right foot: Secondary | ICD-10-CM | POA: Diagnosis not present

## 2020-04-24 DIAGNOSIS — M6088 Other myositis, other site: Secondary | ICD-10-CM | POA: Diagnosis not present

## 2020-04-24 DIAGNOSIS — G4733 Obstructive sleep apnea (adult) (pediatric): Secondary | ICD-10-CM | POA: Diagnosis not present

## 2020-04-24 DIAGNOSIS — J45909 Unspecified asthma, uncomplicated: Secondary | ICD-10-CM | POA: Diagnosis not present

## 2020-04-24 DIAGNOSIS — M2550 Pain in unspecified joint: Secondary | ICD-10-CM | POA: Diagnosis not present

## 2020-04-24 DIAGNOSIS — Z8249 Family history of ischemic heart disease and other diseases of the circulatory system: Secondary | ICD-10-CM | POA: Diagnosis not present

## 2020-04-24 DIAGNOSIS — L03115 Cellulitis of right lower limb: Secondary | ICD-10-CM | POA: Diagnosis not present

## 2020-04-24 DIAGNOSIS — Z7401 Bed confinement status: Secondary | ICD-10-CM | POA: Diagnosis not present

## 2020-04-24 DIAGNOSIS — Z89419 Acquired absence of unspecified great toe: Secondary | ICD-10-CM | POA: Diagnosis not present

## 2020-04-24 DIAGNOSIS — Z87891 Personal history of nicotine dependence: Secondary | ICD-10-CM | POA: Diagnosis not present

## 2020-04-24 DIAGNOSIS — Z741 Need for assistance with personal care: Secondary | ICD-10-CM | POA: Diagnosis not present

## 2020-04-24 DIAGNOSIS — R269 Unspecified abnormalities of gait and mobility: Secondary | ICD-10-CM | POA: Diagnosis not present

## 2020-04-24 DIAGNOSIS — G2581 Restless legs syndrome: Secondary | ICD-10-CM | POA: Diagnosis not present

## 2020-04-24 DIAGNOSIS — S91101D Unspecified open wound of right great toe without damage to nail, subsequent encounter: Secondary | ICD-10-CM | POA: Diagnosis not present

## 2020-04-24 DIAGNOSIS — M79671 Pain in right foot: Secondary | ICD-10-CM | POA: Diagnosis not present

## 2020-04-24 DIAGNOSIS — L03031 Cellulitis of right toe: Secondary | ICD-10-CM | POA: Diagnosis not present

## 2020-04-24 DIAGNOSIS — Z20822 Contact with and (suspected) exposure to covid-19: Secondary | ICD-10-CM | POA: Diagnosis not present

## 2020-04-24 DIAGNOSIS — E11621 Type 2 diabetes mellitus with foot ulcer: Secondary | ICD-10-CM | POA: Diagnosis not present

## 2020-04-24 DIAGNOSIS — B029 Zoster without complications: Secondary | ICD-10-CM | POA: Diagnosis not present

## 2020-04-30 DIAGNOSIS — G4733 Obstructive sleep apnea (adult) (pediatric): Secondary | ICD-10-CM | POA: Diagnosis not present

## 2020-04-30 DIAGNOSIS — J45909 Unspecified asthma, uncomplicated: Secondary | ICD-10-CM | POA: Diagnosis not present

## 2020-04-30 DIAGNOSIS — M2550 Pain in unspecified joint: Secondary | ICD-10-CM | POA: Diagnosis not present

## 2020-04-30 DIAGNOSIS — Z79899 Other long term (current) drug therapy: Secondary | ICD-10-CM | POA: Diagnosis not present

## 2020-04-30 DIAGNOSIS — F3341 Major depressive disorder, recurrent, in partial remission: Secondary | ICD-10-CM | POA: Diagnosis not present

## 2020-04-30 DIAGNOSIS — L97509 Non-pressure chronic ulcer of other part of unspecified foot with unspecified severity: Secondary | ICD-10-CM | POA: Diagnosis not present

## 2020-04-30 DIAGNOSIS — I152 Hypertension secondary to endocrine disorders: Secondary | ICD-10-CM | POA: Diagnosis not present

## 2020-04-30 DIAGNOSIS — E1142 Type 2 diabetes mellitus with diabetic polyneuropathy: Secondary | ICD-10-CM | POA: Diagnosis not present

## 2020-04-30 DIAGNOSIS — M6281 Muscle weakness (generalized): Secondary | ICD-10-CM | POA: Diagnosis not present

## 2020-04-30 DIAGNOSIS — K219 Gastro-esophageal reflux disease without esophagitis: Secondary | ICD-10-CM | POA: Diagnosis not present

## 2020-04-30 DIAGNOSIS — L97519 Non-pressure chronic ulcer of other part of right foot with unspecified severity: Secondary | ICD-10-CM | POA: Diagnosis not present

## 2020-04-30 DIAGNOSIS — K2270 Barrett's esophagus without dysplasia: Secondary | ICD-10-CM | POA: Diagnosis not present

## 2020-04-30 DIAGNOSIS — B9561 Methicillin susceptible Staphylococcus aureus infection as the cause of diseases classified elsewhere: Secondary | ICD-10-CM | POA: Diagnosis not present

## 2020-04-30 DIAGNOSIS — Z89411 Acquired absence of right great toe: Secondary | ICD-10-CM | POA: Diagnosis not present

## 2020-04-30 DIAGNOSIS — M2042 Other hammer toe(s) (acquired), left foot: Secondary | ICD-10-CM | POA: Diagnosis not present

## 2020-04-30 DIAGNOSIS — R4702 Dysphasia: Secondary | ICD-10-CM | POA: Diagnosis not present

## 2020-04-30 DIAGNOSIS — E782 Mixed hyperlipidemia: Secondary | ICD-10-CM | POA: Diagnosis not present

## 2020-04-30 DIAGNOSIS — I10 Essential (primary) hypertension: Secondary | ICD-10-CM | POA: Diagnosis not present

## 2020-04-30 DIAGNOSIS — N400 Enlarged prostate without lower urinary tract symptoms: Secondary | ICD-10-CM | POA: Diagnosis not present

## 2020-04-30 DIAGNOSIS — R41841 Cognitive communication deficit: Secondary | ICD-10-CM | POA: Diagnosis not present

## 2020-04-30 DIAGNOSIS — Z7401 Bed confinement status: Secondary | ICD-10-CM | POA: Diagnosis not present

## 2020-04-30 DIAGNOSIS — R531 Weakness: Secondary | ICD-10-CM | POA: Diagnosis not present

## 2020-04-30 DIAGNOSIS — D500 Iron deficiency anemia secondary to blood loss (chronic): Secondary | ICD-10-CM | POA: Diagnosis not present

## 2020-04-30 DIAGNOSIS — B9620 Unspecified Escherichia coli [E. coli] as the cause of diseases classified elsewhere: Secondary | ICD-10-CM | POA: Diagnosis not present

## 2020-04-30 DIAGNOSIS — L03115 Cellulitis of right lower limb: Secondary | ICD-10-CM | POA: Diagnosis not present

## 2020-04-30 DIAGNOSIS — L97412 Non-pressure chronic ulcer of right heel and midfoot with fat layer exposed: Secondary | ICD-10-CM | POA: Diagnosis not present

## 2020-04-30 DIAGNOSIS — Z794 Long term (current) use of insulin: Secondary | ICD-10-CM | POA: Diagnosis not present

## 2020-04-30 DIAGNOSIS — R278 Other lack of coordination: Secondary | ICD-10-CM | POA: Diagnosis not present

## 2020-04-30 DIAGNOSIS — S91101D Unspecified open wound of right great toe without damage to nail, subsequent encounter: Secondary | ICD-10-CM | POA: Diagnosis not present

## 2020-04-30 DIAGNOSIS — E11621 Type 2 diabetes mellitus with foot ulcer: Secondary | ICD-10-CM | POA: Diagnosis not present

## 2020-04-30 DIAGNOSIS — Z7409 Other reduced mobility: Secondary | ICD-10-CM | POA: Diagnosis not present

## 2020-04-30 DIAGNOSIS — L97512 Non-pressure chronic ulcer of other part of right foot with fat layer exposed: Secondary | ICD-10-CM | POA: Diagnosis not present

## 2020-04-30 DIAGNOSIS — I739 Peripheral vascular disease, unspecified: Secondary | ICD-10-CM | POA: Diagnosis not present

## 2020-04-30 DIAGNOSIS — L03031 Cellulitis of right toe: Secondary | ICD-10-CM | POA: Diagnosis not present

## 2020-04-30 DIAGNOSIS — R269 Unspecified abnormalities of gait and mobility: Secondary | ICD-10-CM | POA: Diagnosis not present

## 2020-04-30 DIAGNOSIS — Z741 Need for assistance with personal care: Secondary | ICD-10-CM | POA: Diagnosis not present

## 2020-04-30 DIAGNOSIS — J309 Allergic rhinitis, unspecified: Secondary | ICD-10-CM | POA: Diagnosis not present

## 2020-05-01 DIAGNOSIS — E11621 Type 2 diabetes mellitus with foot ulcer: Secondary | ICD-10-CM | POA: Diagnosis not present

## 2020-05-01 DIAGNOSIS — L97509 Non-pressure chronic ulcer of other part of unspecified foot with unspecified severity: Secondary | ICD-10-CM | POA: Diagnosis not present

## 2020-05-01 DIAGNOSIS — I152 Hypertension secondary to endocrine disorders: Secondary | ICD-10-CM | POA: Diagnosis not present

## 2020-05-01 DIAGNOSIS — E782 Mixed hyperlipidemia: Secondary | ICD-10-CM | POA: Diagnosis not present

## 2020-05-01 DIAGNOSIS — Z79899 Other long term (current) drug therapy: Secondary | ICD-10-CM | POA: Diagnosis not present

## 2020-05-01 DIAGNOSIS — E1142 Type 2 diabetes mellitus with diabetic polyneuropathy: Secondary | ICD-10-CM | POA: Diagnosis not present

## 2020-05-01 DIAGNOSIS — L03115 Cellulitis of right lower limb: Secondary | ICD-10-CM | POA: Diagnosis not present

## 2020-05-01 DIAGNOSIS — D500 Iron deficiency anemia secondary to blood loss (chronic): Secondary | ICD-10-CM | POA: Diagnosis not present

## 2020-05-07 DIAGNOSIS — E11621 Type 2 diabetes mellitus with foot ulcer: Secondary | ICD-10-CM | POA: Diagnosis not present

## 2020-05-07 DIAGNOSIS — E1142 Type 2 diabetes mellitus with diabetic polyneuropathy: Secondary | ICD-10-CM | POA: Diagnosis not present

## 2020-05-07 DIAGNOSIS — E782 Mixed hyperlipidemia: Secondary | ICD-10-CM | POA: Diagnosis not present

## 2020-05-07 DIAGNOSIS — N400 Enlarged prostate without lower urinary tract symptoms: Secondary | ICD-10-CM | POA: Diagnosis not present

## 2020-05-07 DIAGNOSIS — I152 Hypertension secondary to endocrine disorders: Secondary | ICD-10-CM | POA: Diagnosis not present

## 2020-05-14 DIAGNOSIS — Z794 Long term (current) use of insulin: Secondary | ICD-10-CM | POA: Diagnosis not present

## 2020-05-14 DIAGNOSIS — L97509 Non-pressure chronic ulcer of other part of unspecified foot with unspecified severity: Secondary | ICD-10-CM | POA: Diagnosis not present

## 2020-05-14 DIAGNOSIS — E11621 Type 2 diabetes mellitus with foot ulcer: Secondary | ICD-10-CM | POA: Diagnosis not present

## 2020-05-14 DIAGNOSIS — I152 Hypertension secondary to endocrine disorders: Secondary | ICD-10-CM | POA: Diagnosis not present

## 2020-05-14 DIAGNOSIS — Z89411 Acquired absence of right great toe: Secondary | ICD-10-CM | POA: Diagnosis not present

## 2020-05-14 DIAGNOSIS — L97412 Non-pressure chronic ulcer of right heel and midfoot with fat layer exposed: Secondary | ICD-10-CM | POA: Diagnosis not present

## 2020-05-14 DIAGNOSIS — E1142 Type 2 diabetes mellitus with diabetic polyneuropathy: Secondary | ICD-10-CM | POA: Diagnosis not present

## 2020-05-19 DIAGNOSIS — L97509 Non-pressure chronic ulcer of other part of unspecified foot with unspecified severity: Secondary | ICD-10-CM | POA: Diagnosis not present

## 2020-05-19 DIAGNOSIS — D500 Iron deficiency anemia secondary to blood loss (chronic): Secondary | ICD-10-CM | POA: Diagnosis not present

## 2020-05-19 DIAGNOSIS — E11621 Type 2 diabetes mellitus with foot ulcer: Secondary | ICD-10-CM | POA: Diagnosis not present

## 2020-05-19 DIAGNOSIS — E1142 Type 2 diabetes mellitus with diabetic polyneuropathy: Secondary | ICD-10-CM | POA: Diagnosis not present

## 2020-05-22 DIAGNOSIS — L97412 Non-pressure chronic ulcer of right heel and midfoot with fat layer exposed: Secondary | ICD-10-CM | POA: Diagnosis not present

## 2020-05-22 DIAGNOSIS — E1142 Type 2 diabetes mellitus with diabetic polyneuropathy: Secondary | ICD-10-CM | POA: Diagnosis not present

## 2020-05-22 DIAGNOSIS — E11621 Type 2 diabetes mellitus with foot ulcer: Secondary | ICD-10-CM | POA: Diagnosis not present

## 2020-05-22 DIAGNOSIS — L03115 Cellulitis of right lower limb: Secondary | ICD-10-CM | POA: Diagnosis not present

## 2020-05-23 DIAGNOSIS — K2270 Barrett's esophagus without dysplasia: Secondary | ICD-10-CM | POA: Diagnosis not present

## 2020-05-23 DIAGNOSIS — R339 Retention of urine, unspecified: Secondary | ICD-10-CM | POA: Diagnosis not present

## 2020-05-23 DIAGNOSIS — N401 Enlarged prostate with lower urinary tract symptoms: Secondary | ICD-10-CM | POA: Diagnosis not present

## 2020-05-23 DIAGNOSIS — E782 Mixed hyperlipidemia: Secondary | ICD-10-CM | POA: Diagnosis not present

## 2020-05-23 DIAGNOSIS — E11621 Type 2 diabetes mellitus with foot ulcer: Secondary | ICD-10-CM | POA: Diagnosis not present

## 2020-05-23 DIAGNOSIS — L03031 Cellulitis of right toe: Secondary | ICD-10-CM | POA: Diagnosis not present

## 2020-05-23 DIAGNOSIS — B9620 Unspecified Escherichia coli [E. coli] as the cause of diseases classified elsewhere: Secondary | ICD-10-CM | POA: Diagnosis not present

## 2020-05-23 DIAGNOSIS — I150 Renovascular hypertension: Secondary | ICD-10-CM | POA: Diagnosis not present

## 2020-05-23 DIAGNOSIS — D500 Iron deficiency anemia secondary to blood loss (chronic): Secondary | ICD-10-CM | POA: Diagnosis not present

## 2020-05-23 DIAGNOSIS — E1142 Type 2 diabetes mellitus with diabetic polyneuropathy: Secondary | ICD-10-CM | POA: Diagnosis not present

## 2020-05-23 DIAGNOSIS — J45909 Unspecified asthma, uncomplicated: Secondary | ICD-10-CM | POA: Diagnosis not present

## 2020-05-23 DIAGNOSIS — F3341 Major depressive disorder, recurrent, in partial remission: Secondary | ICD-10-CM | POA: Diagnosis not present

## 2020-05-23 DIAGNOSIS — G4733 Obstructive sleep apnea (adult) (pediatric): Secondary | ICD-10-CM | POA: Diagnosis not present

## 2020-05-23 DIAGNOSIS — M6281 Muscle weakness (generalized): Secondary | ICD-10-CM | POA: Diagnosis not present

## 2020-05-23 DIAGNOSIS — L97512 Non-pressure chronic ulcer of other part of right foot with fat layer exposed: Secondary | ICD-10-CM | POA: Diagnosis not present

## 2020-05-23 DIAGNOSIS — B9562 Methicillin resistant Staphylococcus aureus infection as the cause of diseases classified elsewhere: Secondary | ICD-10-CM | POA: Diagnosis not present

## 2020-05-23 DIAGNOSIS — M5412 Radiculopathy, cervical region: Secondary | ICD-10-CM | POA: Diagnosis not present

## 2020-05-23 DIAGNOSIS — F419 Anxiety disorder, unspecified: Secondary | ICD-10-CM | POA: Diagnosis not present

## 2020-05-29 DIAGNOSIS — L97512 Non-pressure chronic ulcer of other part of right foot with fat layer exposed: Secondary | ICD-10-CM | POA: Diagnosis not present

## 2020-05-29 DIAGNOSIS — Z89411 Acquired absence of right great toe: Secondary | ICD-10-CM | POA: Diagnosis not present

## 2020-05-29 DIAGNOSIS — L03115 Cellulitis of right lower limb: Secondary | ICD-10-CM | POA: Diagnosis not present

## 2020-06-05 DIAGNOSIS — L03115 Cellulitis of right lower limb: Secondary | ICD-10-CM | POA: Diagnosis not present

## 2020-06-05 DIAGNOSIS — L97512 Non-pressure chronic ulcer of other part of right foot with fat layer exposed: Secondary | ICD-10-CM | POA: Diagnosis not present

## 2020-06-05 DIAGNOSIS — E1142 Type 2 diabetes mellitus with diabetic polyneuropathy: Secondary | ICD-10-CM | POA: Diagnosis not present

## 2020-06-05 DIAGNOSIS — Z89411 Acquired absence of right great toe: Secondary | ICD-10-CM | POA: Diagnosis not present

## 2020-06-06 DIAGNOSIS — R339 Retention of urine, unspecified: Secondary | ICD-10-CM | POA: Diagnosis not present

## 2020-06-06 DIAGNOSIS — L97512 Non-pressure chronic ulcer of other part of right foot with fat layer exposed: Secondary | ICD-10-CM | POA: Diagnosis not present

## 2020-06-06 DIAGNOSIS — E782 Mixed hyperlipidemia: Secondary | ICD-10-CM | POA: Diagnosis not present

## 2020-06-06 DIAGNOSIS — G4733 Obstructive sleep apnea (adult) (pediatric): Secondary | ICD-10-CM | POA: Diagnosis not present

## 2020-06-06 DIAGNOSIS — E1142 Type 2 diabetes mellitus with diabetic polyneuropathy: Secondary | ICD-10-CM | POA: Diagnosis not present

## 2020-06-06 DIAGNOSIS — B9620 Unspecified Escherichia coli [E. coli] as the cause of diseases classified elsewhere: Secondary | ICD-10-CM | POA: Diagnosis not present

## 2020-06-06 DIAGNOSIS — B9562 Methicillin resistant Staphylococcus aureus infection as the cause of diseases classified elsewhere: Secondary | ICD-10-CM | POA: Diagnosis not present

## 2020-06-06 DIAGNOSIS — L03031 Cellulitis of right toe: Secondary | ICD-10-CM | POA: Diagnosis not present

## 2020-06-06 DIAGNOSIS — E11621 Type 2 diabetes mellitus with foot ulcer: Secondary | ICD-10-CM | POA: Diagnosis not present

## 2020-06-06 DIAGNOSIS — M5412 Radiculopathy, cervical region: Secondary | ICD-10-CM | POA: Diagnosis not present

## 2020-06-06 DIAGNOSIS — I150 Renovascular hypertension: Secondary | ICD-10-CM | POA: Diagnosis not present

## 2020-06-06 DIAGNOSIS — M6281 Muscle weakness (generalized): Secondary | ICD-10-CM | POA: Diagnosis not present

## 2020-06-06 DIAGNOSIS — N401 Enlarged prostate with lower urinary tract symptoms: Secondary | ICD-10-CM | POA: Diagnosis not present

## 2020-06-06 DIAGNOSIS — F419 Anxiety disorder, unspecified: Secondary | ICD-10-CM | POA: Diagnosis not present

## 2020-06-06 DIAGNOSIS — D500 Iron deficiency anemia secondary to blood loss (chronic): Secondary | ICD-10-CM | POA: Diagnosis not present

## 2020-06-06 DIAGNOSIS — K2270 Barrett's esophagus without dysplasia: Secondary | ICD-10-CM | POA: Diagnosis not present

## 2020-06-06 DIAGNOSIS — J45909 Unspecified asthma, uncomplicated: Secondary | ICD-10-CM | POA: Diagnosis not present

## 2020-06-06 DIAGNOSIS — F3341 Major depressive disorder, recurrent, in partial remission: Secondary | ICD-10-CM | POA: Diagnosis not present

## 2020-06-10 DIAGNOSIS — R351 Nocturia: Secondary | ICD-10-CM | POA: Diagnosis not present

## 2020-06-10 DIAGNOSIS — N401 Enlarged prostate with lower urinary tract symptoms: Secondary | ICD-10-CM | POA: Diagnosis not present

## 2020-06-10 DIAGNOSIS — R972 Elevated prostate specific antigen [PSA]: Secondary | ICD-10-CM | POA: Diagnosis not present

## 2020-06-19 DIAGNOSIS — E1142 Type 2 diabetes mellitus with diabetic polyneuropathy: Secondary | ICD-10-CM | POA: Diagnosis not present

## 2020-06-19 DIAGNOSIS — E11621 Type 2 diabetes mellitus with foot ulcer: Secondary | ICD-10-CM | POA: Diagnosis not present

## 2020-06-19 DIAGNOSIS — L97512 Non-pressure chronic ulcer of other part of right foot with fat layer exposed: Secondary | ICD-10-CM | POA: Diagnosis not present

## 2020-06-19 DIAGNOSIS — L03115 Cellulitis of right lower limb: Secondary | ICD-10-CM | POA: Diagnosis not present

## 2020-06-19 DIAGNOSIS — Z794 Long term (current) use of insulin: Secondary | ICD-10-CM | POA: Diagnosis not present

## 2020-06-19 DIAGNOSIS — Z89411 Acquired absence of right great toe: Secondary | ICD-10-CM | POA: Diagnosis not present

## 2020-07-10 DIAGNOSIS — E1142 Type 2 diabetes mellitus with diabetic polyneuropathy: Secondary | ICD-10-CM | POA: Diagnosis not present

## 2020-07-10 DIAGNOSIS — Z89419 Acquired absence of unspecified great toe: Secondary | ICD-10-CM | POA: Diagnosis not present

## 2020-07-10 DIAGNOSIS — L03115 Cellulitis of right lower limb: Secondary | ICD-10-CM | POA: Diagnosis not present

## 2020-07-10 DIAGNOSIS — E11621 Type 2 diabetes mellitus with foot ulcer: Secondary | ICD-10-CM | POA: Diagnosis not present

## 2020-07-10 DIAGNOSIS — L97412 Non-pressure chronic ulcer of right heel and midfoot with fat layer exposed: Secondary | ICD-10-CM | POA: Diagnosis not present

## 2020-07-21 DIAGNOSIS — Z794 Long term (current) use of insulin: Secondary | ICD-10-CM | POA: Diagnosis not present

## 2020-07-21 DIAGNOSIS — Z7984 Long term (current) use of oral hypoglycemic drugs: Secondary | ICD-10-CM | POA: Diagnosis not present

## 2020-07-21 DIAGNOSIS — I10 Essential (primary) hypertension: Secondary | ICD-10-CM | POA: Diagnosis not present

## 2020-07-21 DIAGNOSIS — E1142 Type 2 diabetes mellitus with diabetic polyneuropathy: Secondary | ICD-10-CM | POA: Diagnosis not present

## 2020-07-21 DIAGNOSIS — E782 Mixed hyperlipidemia: Secondary | ICD-10-CM | POA: Diagnosis not present

## 2020-07-29 DIAGNOSIS — L97511 Non-pressure chronic ulcer of other part of right foot limited to breakdown of skin: Secondary | ICD-10-CM | POA: Diagnosis not present

## 2020-07-29 DIAGNOSIS — E11621 Type 2 diabetes mellitus with foot ulcer: Secondary | ICD-10-CM | POA: Diagnosis not present

## 2020-07-29 DIAGNOSIS — E1142 Type 2 diabetes mellitus with diabetic polyneuropathy: Secondary | ICD-10-CM | POA: Diagnosis not present

## 2020-07-29 DIAGNOSIS — Z89411 Acquired absence of right great toe: Secondary | ICD-10-CM | POA: Diagnosis not present

## 2020-09-03 DIAGNOSIS — E11621 Type 2 diabetes mellitus with foot ulcer: Secondary | ICD-10-CM | POA: Diagnosis not present

## 2020-09-03 DIAGNOSIS — R972 Elevated prostate specific antigen [PSA]: Secondary | ICD-10-CM | POA: Diagnosis not present

## 2020-09-03 DIAGNOSIS — F330 Major depressive disorder, recurrent, mild: Secondary | ICD-10-CM | POA: Diagnosis not present

## 2020-09-03 DIAGNOSIS — Z794 Long term (current) use of insulin: Secondary | ICD-10-CM | POA: Diagnosis not present

## 2020-09-03 DIAGNOSIS — N401 Enlarged prostate with lower urinary tract symptoms: Secondary | ICD-10-CM | POA: Diagnosis not present

## 2020-09-03 DIAGNOSIS — J4520 Mild intermittent asthma, uncomplicated: Secondary | ICD-10-CM | POA: Diagnosis not present

## 2020-09-03 DIAGNOSIS — L97509 Non-pressure chronic ulcer of other part of unspecified foot with unspecified severity: Secondary | ICD-10-CM | POA: Diagnosis not present

## 2020-09-03 DIAGNOSIS — E782 Mixed hyperlipidemia: Secondary | ICD-10-CM | POA: Diagnosis not present

## 2020-09-03 DIAGNOSIS — Z8639 Personal history of other endocrine, nutritional and metabolic disease: Secondary | ICD-10-CM | POA: Diagnosis not present

## 2020-09-03 DIAGNOSIS — I10 Essential (primary) hypertension: Secondary | ICD-10-CM | POA: Diagnosis not present

## 2020-09-03 DIAGNOSIS — Z0000 Encounter for general adult medical examination without abnormal findings: Secondary | ICD-10-CM | POA: Diagnosis not present

## 2020-09-03 DIAGNOSIS — K2270 Barrett's esophagus without dysplasia: Secondary | ICD-10-CM | POA: Diagnosis not present

## 2020-09-03 DIAGNOSIS — R1310 Dysphagia, unspecified: Secondary | ICD-10-CM | POA: Diagnosis not present

## 2020-09-03 DIAGNOSIS — K219 Gastro-esophageal reflux disease without esophagitis: Secondary | ICD-10-CM | POA: Diagnosis not present

## 2020-10-09 DIAGNOSIS — Z86010 Personal history of colonic polyps: Secondary | ICD-10-CM | POA: Diagnosis not present

## 2020-10-09 DIAGNOSIS — R1314 Dysphagia, pharyngoesophageal phase: Secondary | ICD-10-CM | POA: Diagnosis not present

## 2020-10-09 DIAGNOSIS — K2270 Barrett's esophagus without dysplasia: Secondary | ICD-10-CM | POA: Diagnosis not present

## 2020-10-09 DIAGNOSIS — K219 Gastro-esophageal reflux disease without esophagitis: Secondary | ICD-10-CM | POA: Diagnosis not present

## 2020-10-13 DIAGNOSIS — R972 Elevated prostate specific antigen [PSA]: Secondary | ICD-10-CM | POA: Diagnosis not present

## 2020-10-13 DIAGNOSIS — N401 Enlarged prostate with lower urinary tract symptoms: Secondary | ICD-10-CM | POA: Diagnosis not present

## 2020-10-13 DIAGNOSIS — R351 Nocturia: Secondary | ICD-10-CM | POA: Diagnosis not present

## 2020-10-21 DIAGNOSIS — I10 Essential (primary) hypertension: Secondary | ICD-10-CM | POA: Diagnosis not present

## 2020-10-21 DIAGNOSIS — E11621 Type 2 diabetes mellitus with foot ulcer: Secondary | ICD-10-CM | POA: Diagnosis not present

## 2020-10-21 DIAGNOSIS — L97511 Non-pressure chronic ulcer of other part of right foot limited to breakdown of skin: Secondary | ICD-10-CM | POA: Diagnosis not present

## 2020-10-21 DIAGNOSIS — E782 Mixed hyperlipidemia: Secondary | ICD-10-CM | POA: Diagnosis not present

## 2020-10-21 DIAGNOSIS — S98112A Complete traumatic amputation of left great toe, initial encounter: Secondary | ICD-10-CM | POA: Diagnosis not present

## 2020-10-21 DIAGNOSIS — E1142 Type 2 diabetes mellitus with diabetic polyneuropathy: Secondary | ICD-10-CM | POA: Diagnosis not present

## 2020-10-21 DIAGNOSIS — Z794 Long term (current) use of insulin: Secondary | ICD-10-CM | POA: Diagnosis not present

## 2020-12-17 DIAGNOSIS — N401 Enlarged prostate with lower urinary tract symptoms: Secondary | ICD-10-CM | POA: Diagnosis not present

## 2020-12-17 DIAGNOSIS — R972 Elevated prostate specific antigen [PSA]: Secondary | ICD-10-CM | POA: Diagnosis not present

## 2020-12-17 DIAGNOSIS — R351 Nocturia: Secondary | ICD-10-CM | POA: Diagnosis not present

## 2020-12-24 DIAGNOSIS — E782 Mixed hyperlipidemia: Secondary | ICD-10-CM | POA: Diagnosis not present

## 2020-12-24 DIAGNOSIS — I10 Essential (primary) hypertension: Secondary | ICD-10-CM | POA: Diagnosis not present

## 2020-12-24 DIAGNOSIS — E1142 Type 2 diabetes mellitus with diabetic polyneuropathy: Secondary | ICD-10-CM | POA: Diagnosis not present

## 2020-12-24 DIAGNOSIS — Z794 Long term (current) use of insulin: Secondary | ICD-10-CM | POA: Diagnosis not present

## 2020-12-25 ENCOUNTER — Other Ambulatory Visit: Payer: Self-pay | Admitting: Urology

## 2020-12-25 DIAGNOSIS — R972 Elevated prostate specific antigen [PSA]: Secondary | ICD-10-CM

## 2021-01-13 ENCOUNTER — Ambulatory Visit
Admission: RE | Admit: 2021-01-13 | Discharge: 2021-01-13 | Disposition: A | Discharge status: Home / Self Care | Payer: PPO | Source: Ambulatory Visit | Attending: Urology | Admitting: Urology

## 2021-01-13 DIAGNOSIS — R590 Localized enlarged lymph nodes: Secondary | ICD-10-CM | POA: Diagnosis not present

## 2021-01-13 DIAGNOSIS — R972 Elevated prostate specific antigen [PSA]: Secondary | ICD-10-CM

## 2021-01-13 DIAGNOSIS — K5730 Diverticulosis of large intestine without perforation or abscess without bleeding: Secondary | ICD-10-CM | POA: Diagnosis not present

## 2021-01-13 IMAGING — MR MR PROSTATE WO/W CM
12 series · 48 of 48 positions shown · IV contrast (17 ml multihance)
Comparison: None

CLINICAL DATA: Elevated PSA, most recent PSA of 3.9. Post uro lift
in a 70-year-old male.

EXAM:
MR PROSTATE WITHOUT AND WITH CONTRAST
TECHNIQUE: Multiplanar multisequence MRI images were obtained of the pelvis
centered about the prostate. Pre and post contrast images were
obtained.
CONTRAST:  17mL MULTIHANCE GADOBENATE DIMEGLUMINE 529 MG/ML IV SOLN

[Series 3: T2 · coronal · 3.0mm · 0.56mm/px · 1 of 23 slices shown (1 of 3)]
[im 1/23]
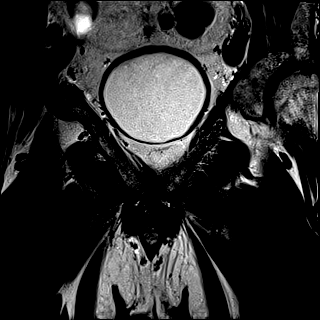

[Series 4: T1 · axial · 5.0mm · 1.25mm/px · 1 of 80 slices shown]
[im 1/80]
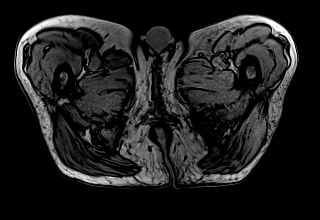

[Series 5: DWI · axial · 3.0mm · 1.75mm/px · z∈[-31,+26]mm · 2 of 60 slices shown (1 of 3)]
[im 1/60]
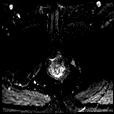
[im 60/60]
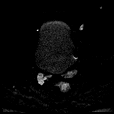

[Series 6: DWI · axial · 3.0mm · 1.75mm/px · 1 of 20 slices shown (2 of 3)]
[im 1/20]
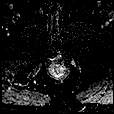

[Series 7: DWI · axial · 3.0mm · 1.75mm/px · 1 of 20 slices shown (3 of 3)]
[im 1/20]
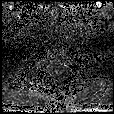

[Series 8: T2 · axial · 3.0mm · 0.56mm/px · 1 of 23 slices shown (2 of 3)]
[im 1/23]
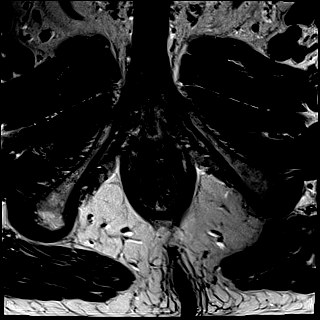

[Series 9: T2 · axial · 1.0mm · 1.04mm/px · z∈[-44,+35]mm · 2 of 80 slices shown (3 of 3)]
[im 1/80]
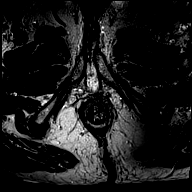
[im 80/80]
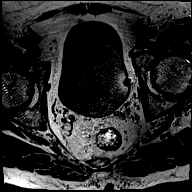

[Series 10: pre t1_twist_tra_dyn · axial · non-contrast · 3.5mm · 0.83mm/px · 1 of 20 slices shown]
[im 1/20]
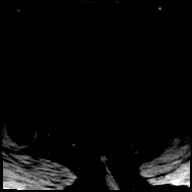

[Series 11: post t1_twist_tra_dyn-copy center · axial · non-contrast · 3.5mm · 0.83mm/px · z∈[-37,+29]mm · 17 of 600 slices shown]
[im 1/600]
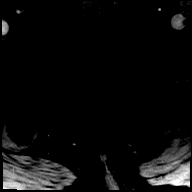
[im 38/600]
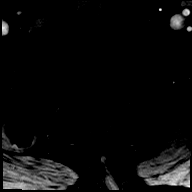
[im 75/600]
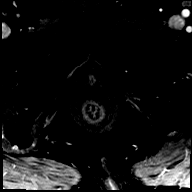
[im 113/600]
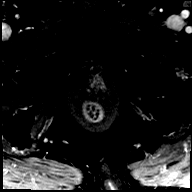
[im 150/600]
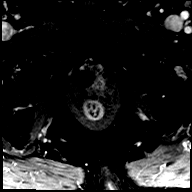
[im 188/600]
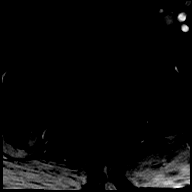
[im 225/600]
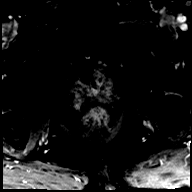
[im 263/600]
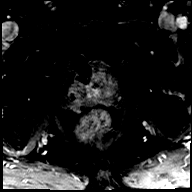
[im 300/600]
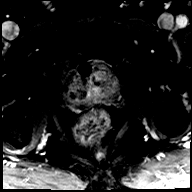
[im 337/600]
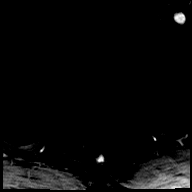
[im 375/600]
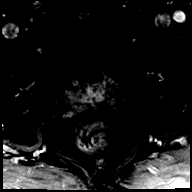
[im 412/600]
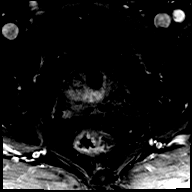
[im 450/600]
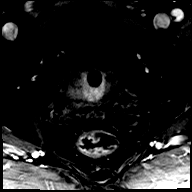
[im 487/600]
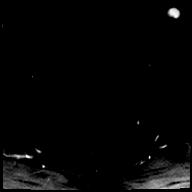
[im 525/600]
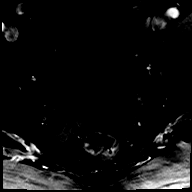
[im 562/600]
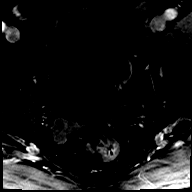
[im 600/600]
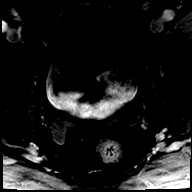

[Series 12: post t1_twist_tra_dyn-copy cent_sub · axial · 3.5mm · 0.83mm/px · z∈[-37,+29]mm · 17 of 575 slices shown]
[im 1/575]
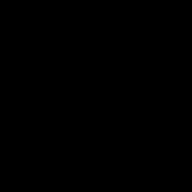
[im 36/575]
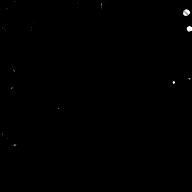
[im 72/575]
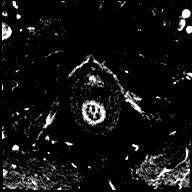
[im 108/575]
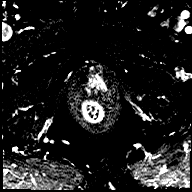
[im 144/575]
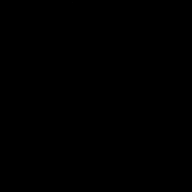
[im 180/575]
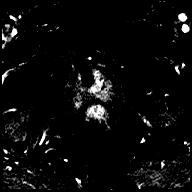
[im 216/575]
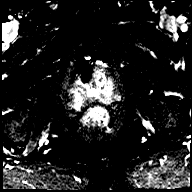
[im 252/575]
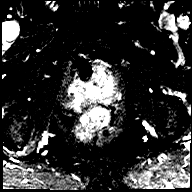
[im 288/575]
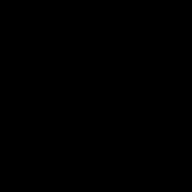
[im 323/575]
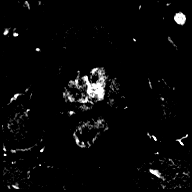
[im 359/575]
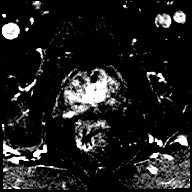
[im 395/575]
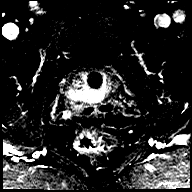
[im 431/575]
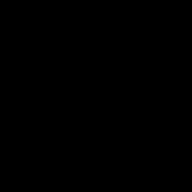
[im 467/575]
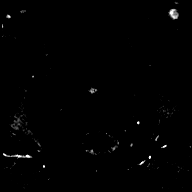
[im 503/575]
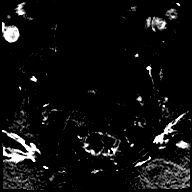
[im 539/575]
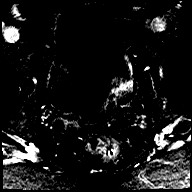
[im 575/575]
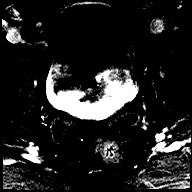

[Series 13: t1_vibe_dixon_tra_f · axial · 2.5mm · 0.91mm/px · z∈[-56,+142]mm · 2 of 80 slices shown]
[im 1/80]
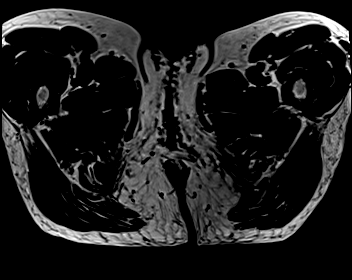
[im 80/80]
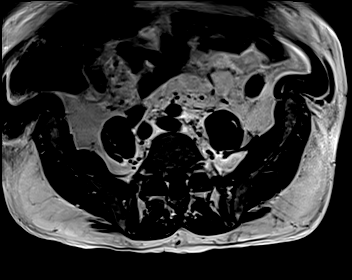

[Series 14: t1_vibe_dixon_tra_w · axial · 2.5mm · 0.91mm/px · z∈[-56,+142]mm · 2 of 80 slices shown]
[im 1/80]
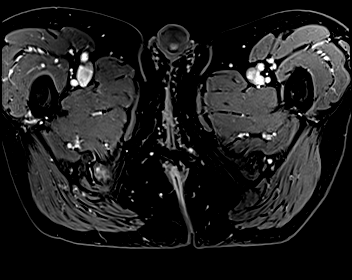
[im 80/80]
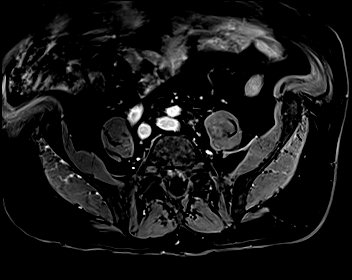

[48 of 48 positions shown; findings below may reference images not displayed]

FINDINGS: Prostate:

Transitional zone: Extensive changes of BPH with area of
hypointensity at the RIGHT prostate base that appears to be
associated with BPH nodules, more hypointense than other areas
measuring approximately 14 mm (image 33 of series 9) contiguous with
area of low signal in the peripheral zone that on diffusion appears
as 1 lesion (image [DATE]) this area in total measures 2 cm, designated
as PIRADS category 3 some of the peripheral zone findings could
relate to asymmetric central zone as this is just below the RIGHT
seminal vesicle.

Peripheral zone: Findings above in the upper margin of the
peripheral zone.

Focal area of T2 signal abnormality in the posterior LEFT peripheral
zone without signs of asymmetric enhancement or of restricted
diffusion. This is seen at the prostate base. This is potentially
scarring related to prior prostatitis

Other areas of wedge-shaped and linear T2 hypointensity in the
peripheral zone.

Focal area of hyperenhancement on image 133 of series 12 in the
RIGHT posterolateral peripheral zone corresponding 2 an area of more
wedge-shaped T2 signal favored to represent sequela of prostatitis.

Diffusion in the transitional zone limited by presence of uro lift
clips.

Volume: 41.8 cc

PSA density: 0.09 ng per mL

Transcapsular spread: Absent though there is some blurring of the
capsular margin near PIRADS 3 lesion at the RIGHT prostate base.

Seminal vesicle involvement: Absent

Neurovascular bundle involvement: Absent

Pelvic adenopathy: Absent

Bone metastasis: Absent

Other findings: Colonic diverticulosis/diverticular disease.
IMPRESSION: 1. PIRADS category 3 lesion at the RIGHT prostate base with some
blurring of the capsular margin. This could represent sequela of
prior inflammation or even bulging BPH nodule adjacent to slightly
asymmetric central zone but is of uncertain significance.

## 2021-01-13 MED ORDER — GADOBENATE DIMEGLUMINE 529 MG/ML IV SOLN
17.0000 mL | Freq: Once | INTRAVENOUS | Status: AC | PRN
  Administered 2021-01-13: 17 mL via INTRAVENOUS

## 2021-02-16 DIAGNOSIS — R972 Elevated prostate specific antigen [PSA]: Secondary | ICD-10-CM | POA: Diagnosis not present

## 2021-02-16 DIAGNOSIS — N401 Enlarged prostate with lower urinary tract symptoms: Secondary | ICD-10-CM | POA: Diagnosis not present

## 2021-02-19 DIAGNOSIS — R350 Frequency of micturition: Secondary | ICD-10-CM | POA: Diagnosis not present

## 2021-02-19 DIAGNOSIS — N5201 Erectile dysfunction due to arterial insufficiency: Secondary | ICD-10-CM | POA: Diagnosis not present

## 2021-02-19 DIAGNOSIS — R3915 Urgency of urination: Secondary | ICD-10-CM | POA: Diagnosis not present

## 2021-02-19 DIAGNOSIS — N401 Enlarged prostate with lower urinary tract symptoms: Secondary | ICD-10-CM | POA: Diagnosis not present

## 2021-02-19 DIAGNOSIS — R972 Elevated prostate specific antigen [PSA]: Secondary | ICD-10-CM | POA: Diagnosis not present

## 2021-03-04 DIAGNOSIS — K2270 Barrett's esophagus without dysplasia: Secondary | ICD-10-CM | POA: Diagnosis not present

## 2021-03-04 DIAGNOSIS — E782 Mixed hyperlipidemia: Secondary | ICD-10-CM | POA: Diagnosis not present

## 2021-03-04 DIAGNOSIS — F330 Major depressive disorder, recurrent, mild: Secondary | ICD-10-CM | POA: Diagnosis not present

## 2021-03-04 DIAGNOSIS — E1142 Type 2 diabetes mellitus with diabetic polyneuropathy: Secondary | ICD-10-CM | POA: Diagnosis not present

## 2021-03-04 DIAGNOSIS — Z794 Long term (current) use of insulin: Secondary | ICD-10-CM | POA: Diagnosis not present

## 2021-03-04 DIAGNOSIS — I10 Essential (primary) hypertension: Secondary | ICD-10-CM | POA: Diagnosis not present

## 2021-03-04 DIAGNOSIS — K219 Gastro-esophageal reflux disease without esophagitis: Secondary | ICD-10-CM | POA: Diagnosis not present

## 2021-03-05 DIAGNOSIS — K2100 Gastro-esophageal reflux disease with esophagitis, without bleeding: Secondary | ICD-10-CM | POA: Diagnosis not present

## 2021-03-05 DIAGNOSIS — K222 Esophageal obstruction: Secondary | ICD-10-CM | POA: Diagnosis not present

## 2021-03-05 DIAGNOSIS — K2289 Other specified disease of esophagus: Secondary | ICD-10-CM | POA: Diagnosis not present

## 2021-03-05 DIAGNOSIS — K449 Diaphragmatic hernia without obstruction or gangrene: Secondary | ICD-10-CM | POA: Diagnosis not present

## 2021-03-05 DIAGNOSIS — K2270 Barrett's esophagus without dysplasia: Secondary | ICD-10-CM | POA: Diagnosis not present

## 2021-03-26 DIAGNOSIS — E782 Mixed hyperlipidemia: Secondary | ICD-10-CM | POA: Diagnosis not present

## 2021-03-26 DIAGNOSIS — Z794 Long term (current) use of insulin: Secondary | ICD-10-CM | POA: Diagnosis not present

## 2021-03-26 DIAGNOSIS — E1142 Type 2 diabetes mellitus with diabetic polyneuropathy: Secondary | ICD-10-CM | POA: Diagnosis not present

## 2021-03-26 DIAGNOSIS — I10 Essential (primary) hypertension: Secondary | ICD-10-CM | POA: Diagnosis not present

## 2021-03-26 DIAGNOSIS — Z7984 Long term (current) use of oral hypoglycemic drugs: Secondary | ICD-10-CM | POA: Diagnosis not present

## 2021-04-01 DIAGNOSIS — N411 Chronic prostatitis: Secondary | ICD-10-CM | POA: Diagnosis not present

## 2021-04-01 DIAGNOSIS — R972 Elevated prostate specific antigen [PSA]: Secondary | ICD-10-CM | POA: Diagnosis not present

## 2021-04-07 DIAGNOSIS — S98112A Complete traumatic amputation of left great toe, initial encounter: Secondary | ICD-10-CM | POA: Diagnosis not present

## 2021-04-07 DIAGNOSIS — Z89411 Acquired absence of right great toe: Secondary | ICD-10-CM | POA: Diagnosis not present

## 2021-04-07 DIAGNOSIS — E1142 Type 2 diabetes mellitus with diabetic polyneuropathy: Secondary | ICD-10-CM | POA: Diagnosis not present

## 2021-04-07 DIAGNOSIS — E119 Type 2 diabetes mellitus without complications: Secondary | ICD-10-CM | POA: Diagnosis not present

## 2021-04-09 DIAGNOSIS — R350 Frequency of micturition: Secondary | ICD-10-CM | POA: Diagnosis not present

## 2021-04-09 DIAGNOSIS — N401 Enlarged prostate with lower urinary tract symptoms: Secondary | ICD-10-CM | POA: Diagnosis not present

## 2021-04-09 DIAGNOSIS — R3915 Urgency of urination: Secondary | ICD-10-CM | POA: Diagnosis not present

## 2021-04-09 DIAGNOSIS — R351 Nocturia: Secondary | ICD-10-CM | POA: Diagnosis not present

## 2021-05-20 DIAGNOSIS — R972 Elevated prostate specific antigen [PSA]: Secondary | ICD-10-CM | POA: Diagnosis not present

## 2021-05-20 DIAGNOSIS — N401 Enlarged prostate with lower urinary tract symptoms: Secondary | ICD-10-CM | POA: Diagnosis not present

## 2021-07-28 DIAGNOSIS — Z7985 Long-term (current) use of injectable non-insulin antidiabetic drugs: Secondary | ICD-10-CM | POA: Diagnosis not present

## 2021-07-28 DIAGNOSIS — Z87891 Personal history of nicotine dependence: Secondary | ICD-10-CM | POA: Diagnosis not present

## 2021-07-28 DIAGNOSIS — E782 Mixed hyperlipidemia: Secondary | ICD-10-CM | POA: Diagnosis not present

## 2021-07-28 DIAGNOSIS — Z89411 Acquired absence of right great toe: Secondary | ICD-10-CM | POA: Diagnosis not present

## 2021-07-28 DIAGNOSIS — Z89412 Acquired absence of left great toe: Secondary | ICD-10-CM | POA: Diagnosis not present

## 2021-07-28 DIAGNOSIS — I10 Essential (primary) hypertension: Secondary | ICD-10-CM | POA: Diagnosis not present

## 2021-07-28 DIAGNOSIS — Z794 Long term (current) use of insulin: Secondary | ICD-10-CM | POA: Diagnosis not present

## 2021-07-28 DIAGNOSIS — E1142 Type 2 diabetes mellitus with diabetic polyneuropathy: Secondary | ICD-10-CM | POA: Diagnosis not present

## 2021-07-28 DIAGNOSIS — Z7984 Long term (current) use of oral hypoglycemic drugs: Secondary | ICD-10-CM | POA: Diagnosis not present

## 2021-11-10 ENCOUNTER — Telehealth: Payer: Self-pay

## 2021-11-10 NOTE — Telephone Encounter (Signed)
I have called and left a voicemail for patient to call back and schedule a recall colonoscopy with pre-visit.  ?
